# Patient Record
Sex: Female | Born: 1960 | Race: White | Hispanic: No | Smoking: Never smoker
Health system: Southern US, Community
[De-identification: ages and names within clinical notes are randomized; demographics above are authoritative.]

## PROBLEM LIST (undated history)

## (undated) DIAGNOSIS — R928 Other abnormal and inconclusive findings on diagnostic imaging of breast: Secondary | ICD-10-CM

## (undated) DIAGNOSIS — Z90710 Acquired absence of both cervix and uterus: Secondary | ICD-10-CM

## (undated) DIAGNOSIS — E782 Mixed hyperlipidemia: Secondary | ICD-10-CM

## (undated) HISTORY — DX: Acquired absence of both cervix and uterus: Z90.710

## (undated) HISTORY — PX: ABDOMINAL HYSTERECTOMY: SHX81

## (undated) HISTORY — DX: Mixed hyperlipidemia: E78.2

## (undated) HISTORY — PX: TONSILLECTOMY: SUR1361

---

## 2018-10-01 ENCOUNTER — Other Ambulatory Visit: Payer: Self-pay | Admitting: Medical

## 2018-10-01 DIAGNOSIS — R928 Other abnormal and inconclusive findings on diagnostic imaging of breast: Secondary | ICD-10-CM

## 2018-10-08 ENCOUNTER — Ambulatory Visit
Admission: RE | Admit: 2018-10-08 | Discharge: 2018-10-08 | Disposition: A | Discharge status: Home / Self Care | Payer: Managed Care, Other (non HMO) | Source: Ambulatory Visit | Attending: Medical | Admitting: Medical

## 2018-10-08 DIAGNOSIS — R928 Other abnormal and inconclusive findings on diagnostic imaging of breast: Secondary | ICD-10-CM

## 2018-11-12 ENCOUNTER — Other Ambulatory Visit: Payer: Self-pay | Admitting: General Surgery

## 2018-11-12 DIAGNOSIS — R928 Other abnormal and inconclusive findings on diagnostic imaging of breast: Secondary | ICD-10-CM

## 2018-11-19 NOTE — Pre-Procedure Instructions (Signed)
[facility]  11/19/2018      [facility] [place], [place] - [address] [place] [address] Phone: [phone] Fax: [phone]    Your procedure is scheduled on Tues., November 27, 2018 from 3:30PM-4:30PM  Report to [facility] Entrance "A" at 1:30PM  Call this number if you have problems the morning of surgery:  [phone]   Remember:  Do not eat after midnight on March 2nd  You may drink clear liquids until 3 hours (12:30PM) prior to surgery .  Clear liquids allowed are:  Water, Juice (non-citric and without pulp), Carbonated beverages, Clear Tea, Black Coffee only, Plain Jell-O only, Gatorade and Plain Popsicles only    Take these medicines the morning of surgery with A SIP OF WATER: NONE  As of today, stop taking all Other Aspirin Products, Vitamins, Fish oils, and Herbal medications. Also stop all NSAIDS i.e. Advil, Ibuprofen, Motrin, Aleve, Anaprox, Naproxen, BC, Goody Powders, and all Supplements.    Do not wear jewelry, make-up or nail polish.  Do not wear lotions, powders, or perfumes, or deodorant.  Do not shave 48 hours prior to surgery.    Do not bring valuables to the hospital.  [facility] is not responsible for any belongings or valuables.  Contacts, dentures or bridgework may not be worn into surgery.  Leave your suitcase in the car.  After surgery it may be brought to your room.  For patients admitted to the hospital, discharge time will be determined by your treatment team.  Patients discharged the day of surgery will not be allowed to drive home.   Special instructions:  [facility]- Preparing For Surgery  Before surgery, you can play an important role. Because skin is not sterile, your skin needs to be as free of germs as possible. You can reduce the number of germs on your skin by washing with CHG (chlorahexidine gluconate) Soap before surgery.  CHG is an antiseptic cleaner which kills germs and bonds with the skin to  continue killing germs even after washing.    Oral Hygiene is also important to reduce your risk of infection.  Remember - BRUSH YOUR TEETH THE MORNING OF SURGERY WITH YOUR REGULAR TOOTHPASTE  Please do not use if you have an allergy to CHG or antibacterial soaps. If your skin becomes reddened/irritated stop using the CHG.  Do not shave (including legs and underarms) for at least 48 hours prior to first CHG shower. It is OK to shave your face.  Please follow these instructions carefully.   1. Shower the NIGHT BEFORE SURGERY and the MORNING OF SURGERY with CHG.   2. If you chose to wash your hair, wash your hair first as usual with your normal shampoo.  3. After you shampoo, rinse your hair and body thoroughly to remove the shampoo.  4. Use CHG as you would any other liquid soap. You can apply CHG directly to the skin and wash gently with a scrungie or a clean washcloth.   5. Apply the CHG Soap to your body ONLY FROM THE NECK DOWN.  Do not use on open wounds or open sores. Avoid contact with your eyes, ears, mouth and genitals (private parts). Wash Face and genitals (private parts)  with your normal soap.  6. Wash thoroughly, paying special attention to the area where your surgery will be performed.  7. Thoroughly rinse your body with warm water from the neck down.  8. DO NOT shower/wash with your  normal soap after using and rinsing off the CHG Soap.  9. Pat yourself dry with a CLEAN TOWEL.  10. Wear CLEAN PAJAMAS to bed the night before surgery, wear comfortable clothes the morning of surgery  11. Place CLEAN SHEETS on your bed the night of your first shower and DO NOT SLEEP WITH PETS.  Day of Surgery:  Do not apply any deodorants/lotions.  Please wear clean clothes to the hospital/surgery center.   Remember to brush your teeth WITH YOUR REGULAR TOOTHPASTE.  Please read over the following fact sheets that you were given. Pain Booklet, Coughing and Deep Breathing and Surgical  Site Infection Prevention

## 2018-11-20 ENCOUNTER — Encounter (HOSPITAL_COMMUNITY): Payer: Self-pay

## 2018-11-20 ENCOUNTER — Other Ambulatory Visit: Payer: Self-pay

## 2018-11-20 ENCOUNTER — Encounter (HOSPITAL_COMMUNITY)
Admission: RE | Admit: 2018-11-20 | Discharge: 2018-11-20 | Disposition: A | Discharge status: Home / Self Care | Payer: Managed Care, Other (non HMO) | Source: Ambulatory Visit | Attending: General Surgery | Admitting: General Surgery

## 2018-11-20 DIAGNOSIS — Z01812 Encounter for preprocedural laboratory examination: Secondary | ICD-10-CM | POA: Insufficient documentation

## 2018-11-20 HISTORY — DX: Other abnormal and inconclusive findings on diagnostic imaging of breast: R92.8

## 2018-11-20 LAB — BASIC METABOLIC PANEL
Anion gap: 9 (ref 5–15)
BUN: 12 mg/dL (ref 6–20)
CO2: 25 mmol/L (ref 22–32)
Calcium: 9.4 mg/dL (ref 8.9–10.3)
Chloride: 105 mmol/L (ref 98–111)
Creatinine, Ser: 1.15 mg/dL — ABNORMAL HIGH (ref 0.44–1.00)
GFR calc Af Amer: >60 mL/min (ref >60)
GFR, EST NON AFRICAN AMERICAN: 53 mL/min — AB (ref >60)
Glucose, Bld: 104 mg/dL — ABNORMAL HIGH (ref 70–99)
Potassium: 4.3 mmol/L (ref 3.5–5.1)
Sodium: 139 mmol/L (ref 135–145)

## 2018-11-20 LAB — CBC
HCT: 40.9 % (ref 36.0–46.0)
Hemoglobin: 13.5 g/dL (ref 12.0–15.0)
MCH: 28.7 pg (ref 26.0–34.0)
MCHC: 33 g/dL (ref 30.0–36.0)
MCV: 87 fL (ref 80.0–100.0)
Platelets: 195 10*3/uL (ref 150–400)
RBC: 4.7 MIL/uL (ref 3.87–5.11)
RDW: 12.5 % (ref 11.5–15.5)
WBC: 5.1 10*3/uL (ref 4.0–10.5)
nRBC: 0 % (ref 0.0–0.2)

## 2018-11-20 NOTE — Progress Notes (Signed)
PCP - Dr. [name]  Cardiologist - Denies  Chest x-ray - Denies  EKG - Denies  Stress Test - Denies  ECHO - Denies  Cardiac Cath - Denies  AICD-na PM-na LOOP-na  Sleep Study - Denies CPAP - None  LABS- 11/20/2018: CBC, BMP  ASA-Denies   Anesthesia- No  Pt denies having chest pain, sob, or fever at this time. All instructions explained to the pt, with a verbal understanding of the material. Pt agrees to go over the instructions while at home for a better understanding. The opportunity to ask questions was provided.

## 2018-11-23 ENCOUNTER — Ambulatory Visit
Admission: RE | Admit: 2018-11-23 | Discharge: 2018-11-23 | Disposition: A | Discharge status: Home / Self Care | Payer: Managed Care, Other (non HMO) | Source: Ambulatory Visit | Attending: General Surgery | Admitting: General Surgery

## 2018-11-23 DIAGNOSIS — R928 Other abnormal and inconclusive findings on diagnostic imaging of breast: Secondary | ICD-10-CM

## 2018-11-26 NOTE — H&P (Signed)
Vanessa Morse Documented: 11/12/2018 1:43 PM Location: [facility] Patient #: [id number] DOB: [birth year] Divorced / Language: [place] / Race: White Female   History of Present Illness [name]; 11/12/2018 2:37 PM) The patient is a 58 year old female who presents with a complaint of Breast problems. 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 Patient underwent a screening mammogram and was fine found to have architectural distortion on the right upper outer quadrant. Diagnostic imaging was performed and core needle biopsy was done. 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  The patient has not had breast problems in the past. Her grandmother had breast cancer but she is not sure at what age. She does know her grandmother died when she was in her 72s, but also notes that she was a longtime smoker and so she is not sure of the cause of death.  Patient also reports that she has had an inverted right nipple as long as she can remember.   right breast biopsy 10/08/2018 CLINICAL DATA: 58 year old female with right breast distortion.  EXAM: RIGHT BREAST STEREOTACTIC CORE NEEDLE BIOPSY  COMPARISON: Previous exams.  FINDINGS: The patient and I discussed the procedure of stereotactic-guided biopsy including benefits and alternatives. We discussed the high likelihood of a successful procedure. We discussed the risks of the procedure including infection, bleeding, tissue injury, clip migration, and inadequate sampling. Informed written consent was given. The usual time out protocol was performed immediately prior to the procedure.  Using sterile technique and 1% Lidocaine as local anesthetic, under stereotactic guidance, a 9 gauge vacuum assisted device was used to perform core needle biopsy of distortion in the upper-outer  right breast anterior depth using a superior approach. Scratch  Lesion quadrant: Upper outer quadrant  At the conclusion of the procedure, a coil shaped tissue marker clip was deployed into the biopsy cavity. Follow-up 2-view mammogram was performed and dictated separately.  IMPRESSION: Stereotactic-guided biopsy of the right breast. No apparent complications.  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   Past Surgical History [name], [name]; 11/12/2018 1:44 PM) Breast Biopsy  Right. Hysterectomy (not due to cancer) - Complete  Tonsillectomy   Diagnostic Studies History [name], [name]; 11/12/2018 1:44 PM) Colonoscopy  never Mammogram  within last year Pap Smear  1-5 years ago  Allergies [name], [name]; 11/12/2018 1:45 PM) CODEINE  Allergies Reconciled   Medication History [name], [name]; 11/12/2018 1:45 PM) Medications Reconciled  Social History [name], [name]; 11/12/2018 1:44 PM) Alcohol use  Occasional alcohol use. Caffeine use  Tea. No drug use  Tobacco use  Never smoker.  Family History [name], [place]; 11/12/2018 1:44 PM) Arthritis  Mother. Breast Cancer  Family Members In General. Heart Disease  Mother. Hypertension  Brother, Father, Mother, Sister. Kidney Disease  Mother. Respiratory Condition  Family Members In General, Mother.  Pregnancy / Birth History [name], [place]; 11/12/2018 1:44 PM) Age at menarche  13 years. Age of menopause  103-50 Contraceptive History  Oral contraceptives. Gravida  0 Irregular periods  Para  0  Other Problems [name], [place]; 11/12/2018 1:44 PM) No pertinent past medical history  Oophorectomy     Review of Systems [name] [name]; 11/12/2018 1:44 PM) General Not Present- Appetite Loss, Chills, Fatigue, Fever, Night Sweats, Weight  Gain  and Weight Loss. Skin Not Present- Change in Wart/Mole, Dryness, Hives, Jaundice, New Lesions, Non-Healing Wounds, Rash and Ulcer. HEENT Present- Wears glasses/contact lenses. Not Present- Earache, Hearing Loss, Hoarseness, Nose Bleed, Oral Ulcers, Ringing in the Ears, Seasonal Allergies, Sinus Pain, Sore Throat, Visual Disturbances and Yellow Eyes. Respiratory Not Present- Bloody sputum, Chronic Cough, Difficulty Breathing, Snoring and Wheezing. Breast Present- Breast Mass. Not Present- Breast Pain, Nipple Discharge and Skin Changes. Cardiovascular Not Present- Chest Pain, Difficulty Breathing Lying Down, Leg Cramps, Palpitations, Rapid Heart Rate, Shortness of Breath and Swelling of Extremities. Gastrointestinal Not Present- Abdominal Pain, Bloating, Bloody Stool, Change in Bowel Habits, Chronic diarrhea, Constipation, Difficulty Swallowing, Excessive gas, Gets full quickly at meals, Hemorrhoids, Indigestion, Nausea, Rectal Pain and Vomiting. Female Genitourinary Not Present- Frequency, Nocturia, Painful Urination, Pelvic Pain and Urgency. Musculoskeletal Not Present- Back Pain, Joint Pain, Joint Stiffness, Muscle Pain, Muscle Weakness and Swelling of Extremities. Neurological Not Present- Decreased Memory, Fainting, Headaches, Numbness, Seizures, Tingling, Tremor, Trouble walking and Weakness. Psychiatric Not Present- Anxiety, Bipolar, Change in Sleep Pattern, Depression, Fearful and Frequent crying. Endocrine Not Present- Cold Intolerance, Excessive Hunger, Hair Changes, Heat Intolerance, Hot flashes and New Diabetes. Hematology Not Present- Blood Thinners, Easy Bruising, Excessive bleeding, Gland problems, HIV and Persistent Infections.  Vitals [name] [name]; 11/12/2018 1:44 PM) 11/12/2018 1:44 PM Weight: 175.2 lb Height: 64in Body Surface Area: 1.85 m Body Mass Index: 30.07 kg/m  Temp.: 97.32F  Pulse: 104 (Regular)  BP: 152/84 (Sitting, Left Arm,  Standard)       Physical Exam [name]; 11/12/2018 2:38 PM) General Mental Status-Alert. General Appearance-Consistent with stated age. Hydration-Well hydrated. Voice-Normal.  Head and Neck Head-normocephalic, atraumatic with no lesions or palpable masses. Trachea-midline. Thyroid Gland Characteristics - normal size and consistency.  Eye Eyeball - Bilateral-Extraocular movements intact. Sclera/Conjunctiva - Bilateral-No scleral icterus.  Chest and Lung Exam Chest and lung exam reveals -quiet, even and easy respiratory effort with no use of accessory muscles and on auscultation, normal breath sounds, no adventitious sounds and normal vocal resonance. Inspection Chest Wall - Normal. Back - normal.  Breast Note: no palpable lesions. right nipple inverted. mild ptosis. no LAD. no nipple discharge. left breast normal. right breast sl smaller.   Cardiovascular Cardiovascular examination reveals -normal heart sounds, regular rate and rhythm with no murmurs and normal pedal pulses bilaterally.  Abdomen Inspection Inspection of the abdomen reveals - No Hernias. Palpation/Percussion Palpation and Percussion of the abdomen reveal - Soft, Non Tender, No Rebound tenderness, No Rigidity (guarding) and No hepatosplenomegaly. Auscultation Auscultation of the abdomen reveals - Bowel sounds normal.  Neurologic Neurologic evaluation reveals -alert and oriented x 3 with no impairment of recent or remote memory. Mental Status-Normal.  Musculoskeletal Global Assessment -Note: no gross deformities.  Normal Exam - Left-Upper Extremity Strength Normal and Lower Extremity Strength Normal. Normal Exam - Right-Upper Extremity Strength Normal and Lower Extremity Strength Normal.  Lymphatic Head & Neck  General Head & Neck Lymphatics: Bilateral - Description - Normal. Axillary  General Axillary Region: Bilateral - Description - Normal. Tenderness -  Non Tender. Femoral & Inguinal  Generalized Femoral & Inguinal Lymphatics: Bilateral - Description - No Generalized lymphadenopathy.    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 We will do a seed localized excisional biopsy.  I discussed the procedure with the patient. I reviewed  the timeline as well as risks associated with the procedure. I discussed risk of bleeding, infection, seroma, pathology of cancer, possible need for additional procedures.  I discussed that I would try my best to hide the scar in her axilla or at the border of her nipple. I discussed how we examined the specimen in the operating room to make sure we have the previous biopsy site. We will do this at the first available opportunity. Current Plans Pt Education - [facility] Breast Biopsy HCI: discussed with patient and provided information. You are being scheduled for surgery- Our schedulers will call you.  You should hear from our office's scheduling department within 5 working days about the location, date, and time of surgery. We try to make accommodations for patient's preferences in scheduling surgery, but sometimes the OR schedule or the surgeon's schedule prevents [place] from making those accommodations.  If you have not heard from our office [phone]) in 5 working days, call the office and ask for your surgeon's nurse.  If you have other questions about your diagnosis, plan, or surgery, call the office and ask for your surgeon's nurse.    Signed by Almond Lint, MD (11/12/2018 2:40 PM)

## 2018-11-27 ENCOUNTER — Encounter (HOSPITAL_COMMUNITY)
Admission: RE | Disposition: A | Discharge status: Home / Self Care | Payer: Self-pay | Source: Home / Self Care | Attending: General Surgery

## 2018-11-27 ENCOUNTER — Encounter (HOSPITAL_COMMUNITY): Payer: Self-pay | Admitting: *Deleted

## 2018-11-27 ENCOUNTER — Ambulatory Visit (HOSPITAL_COMMUNITY): Payer: Managed Care, Other (non HMO) | Admitting: Physician Assistant

## 2018-11-27 ENCOUNTER — Ambulatory Visit
Admission: RE | Admit: 2018-11-27 | Discharge: 2018-11-27 | Disposition: A | Discharge status: Home / Self Care | Payer: Managed Care, Other (non HMO) | Source: Ambulatory Visit | Attending: General Surgery | Admitting: General Surgery

## 2018-11-27 ENCOUNTER — Ambulatory Visit (HOSPITAL_COMMUNITY)
Admission: RE | Admit: 2018-11-27 | Discharge: 2018-11-27 | Disposition: A | Discharge status: Home / Self Care | Payer: Managed Care, Other (non HMO) | Attending: General Surgery | Admitting: General Surgery

## 2018-11-27 ENCOUNTER — Ambulatory Visit (HOSPITAL_COMMUNITY): Payer: Managed Care, Other (non HMO) | Admitting: Anesthesiology

## 2018-11-27 DIAGNOSIS — Z885 Allergy status to narcotic agent status: Secondary | ICD-10-CM | POA: Diagnosis not present

## 2018-11-27 DIAGNOSIS — Z8249 Family history of ischemic heart disease and other diseases of the circulatory system: Secondary | ICD-10-CM | POA: Insufficient documentation

## 2018-11-27 DIAGNOSIS — R928 Other abnormal and inconclusive findings on diagnostic imaging of breast: Secondary | ICD-10-CM

## 2018-11-27 DIAGNOSIS — R921 Mammographic calcification found on diagnostic imaging of breast: Secondary | ICD-10-CM | POA: Diagnosis present

## 2018-11-27 DIAGNOSIS — N6489 Other specified disorders of breast: Secondary | ICD-10-CM | POA: Diagnosis not present

## 2018-11-27 DIAGNOSIS — N6011 Diffuse cystic mastopathy of right breast: Secondary | ICD-10-CM | POA: Insufficient documentation

## 2018-11-27 HISTORY — PX: RADIOACTIVE SEED GUIDED EXCISIONAL BREAST BIOPSY: SHX6490

## 2018-11-27 SURGERY — RADIOACTIVE SEED GUIDED BREAST BIOPSY
Anesthesia: General | Site: Breast | Laterality: Right

## 2018-11-27 MED ORDER — DEXAMETHASONE SODIUM PHOSPHATE 10 MG/ML IJ SOLN
INTRAMUSCULAR | Status: DC | PRN
  Administered 2018-11-27: 5 mg via INTRAVENOUS

## 2018-11-27 MED ORDER — ONDANSETRON HCL 4 MG/2ML IJ SOLN
INTRAMUSCULAR | Status: DC | PRN
  Administered 2018-11-27: 4 mg via INTRAVENOUS

## 2018-11-27 MED ORDER — CEFAZOLIN SODIUM-DEXTROSE 2-4 GM/100ML-% IV SOLN
2.0000 g | INTRAVENOUS | Status: AC
  Administered 2018-11-27: 2 g via INTRAVENOUS

## 2018-11-27 MED ORDER — LACTATED RINGERS IV SOLN
INTRAVENOUS | Status: DC
  Administered 2018-11-27 (×3): via INTRAVENOUS

## 2018-11-27 MED ORDER — LIDOCAINE HCL 1 % IJ SOLN
INTRAMUSCULAR | Status: DC | PRN
  Administered 2018-11-27: 10 mL

## 2018-11-27 MED ORDER — 0.9 % SODIUM CHLORIDE (POUR BTL) OPTIME
TOPICAL | Status: DC | PRN
  Administered 2018-11-27: 1000 mL

## 2018-11-27 MED ORDER — ONDANSETRON HCL 4 MG/2ML IJ SOLN
INTRAMUSCULAR | Status: AC
  Filled 2018-11-27: qty 2

## 2018-11-27 MED ORDER — LIDOCAINE 2% (20 MG/ML) 5 ML SYRINGE
INTRAMUSCULAR | Status: AC
  Filled 2018-11-27: qty 5

## 2018-11-27 MED ORDER — PROPOFOL 10 MG/ML IV BOLUS
INTRAVENOUS | Status: DC | PRN
  Administered 2018-11-27 (×2): 100 mg via INTRAVENOUS

## 2018-11-27 MED ORDER — PROMETHAZINE HCL 25 MG/ML IJ SOLN: 6.2500 mg | INTRAMUSCULAR | Status: DC | PRN

## 2018-11-27 MED ORDER — ACETAMINOPHEN 500 MG PO TABS
ORAL_TABLET | ORAL | Status: AC
  Administered 2018-11-27: 1000 mg via ORAL
  Filled 2018-11-27: qty 2

## 2018-11-27 MED ORDER — FENTANYL CITRATE (PF) 250 MCG/5ML IJ SOLN
INTRAMUSCULAR | Status: DC | PRN
  Administered 2018-11-27: 50 ug via INTRAVENOUS
  Administered 2018-11-27: 25 ug via INTRAVENOUS
  Administered 2018-11-27 (×2): 50 ug via INTRAVENOUS

## 2018-11-27 MED ORDER — CHLORHEXIDINE GLUCONATE CLOTH 2 % EX PADS: 6.0000 | MEDICATED_PAD | Freq: Once | CUTANEOUS | Status: DC

## 2018-11-27 MED ORDER — CELECOXIB 200 MG PO CAPS
ORAL_CAPSULE | ORAL | Status: AC
  Administered 2018-11-27: 200 mg via ORAL
  Filled 2018-11-27: qty 1

## 2018-11-27 MED ORDER — DEXAMETHASONE SODIUM PHOSPHATE 10 MG/ML IJ SOLN
INTRAMUSCULAR | Status: AC
  Filled 2018-11-27: qty 1

## 2018-11-27 MED ORDER — SCOPOLAMINE 1 MG/3DAYS TD PT72
1.0000 | MEDICATED_PATCH | Freq: Once | TRANSDERMAL | Status: DC
  Administered 2018-11-27: 1.5 mg via TRANSDERMAL
  Filled 2018-11-27: qty 1

## 2018-11-27 MED ORDER — OXYCODONE HCL 5 MG PO TABS: 5.0000 mg | ORAL_TABLET | Freq: Four times a day (QID) | ORAL | 0 refills | Status: DC | PRN

## 2018-11-27 MED ORDER — LIDOCAINE-EPINEPHRINE 1 %-1:100000 IJ SOLN
INTRAMUSCULAR | Status: AC
  Filled 2018-11-27: qty 1

## 2018-11-27 MED ORDER — MEPERIDINE HCL 50 MG/ML IJ SOLN: 6.2500 mg | INTRAMUSCULAR | Status: DC | PRN

## 2018-11-27 MED ORDER — ACETAMINOPHEN 500 MG PO TABS
1000.0000 mg | ORAL_TABLET | ORAL | Status: AC
  Administered 2018-11-27: 1000 mg via ORAL

## 2018-11-27 MED ORDER — MIDAZOLAM HCL 2 MG/2ML IJ SOLN: 0.5000 mg | Freq: Once | INTRAMUSCULAR | Status: DC | PRN

## 2018-11-27 MED ORDER — CELECOXIB 200 MG PO CAPS
200.0000 mg | ORAL_CAPSULE | ORAL | Status: AC
  Administered 2018-11-27: 200 mg via ORAL

## 2018-11-27 MED ORDER — CEFAZOLIN SODIUM-DEXTROSE 2-4 GM/100ML-% IV SOLN
INTRAVENOUS | Status: AC
  Filled 2018-11-27: qty 100

## 2018-11-27 MED ORDER — PROPOFOL 10 MG/ML IV BOLUS
INTRAVENOUS | Status: AC
  Filled 2018-11-27: qty 20

## 2018-11-27 MED ORDER — BUPIVACAINE HCL (PF) 0.25 % IJ SOLN
INTRAMUSCULAR | Status: AC
  Filled 2018-11-27: qty 30

## 2018-11-27 MED ORDER — LIDOCAINE 2% (20 MG/ML) 5 ML SYRINGE
INTRAMUSCULAR | Status: DC | PRN
  Administered 2018-11-27: 80 mg via INTRAVENOUS

## 2018-11-27 MED ORDER — MIDAZOLAM HCL 2 MG/2ML IJ SOLN
INTRAMUSCULAR | Status: DC | PRN
  Administered 2018-11-27: 2 mg via INTRAVENOUS

## 2018-11-27 MED ORDER — MIDAZOLAM HCL 2 MG/2ML IJ SOLN
INTRAMUSCULAR | Status: AC
  Filled 2018-11-27: qty 2

## 2018-11-27 MED ORDER — FENTANYL CITRATE (PF) 100 MCG/2ML IJ SOLN: 25.0000 ug | INTRAMUSCULAR | Status: DC | PRN

## 2018-11-27 MED ORDER — FENTANYL CITRATE (PF) 250 MCG/5ML IJ SOLN
INTRAMUSCULAR | Status: AC
  Filled 2018-11-27: qty 5

## 2018-11-27 SURGICAL SUPPLY — 45 items
APPLIER CLIP 9.375 MED OPEN (MISCELLANEOUS)
BINDER BREAST LRG (GAUZE/BANDAGES/DRESSINGS) IMPLANT
BINDER BREAST XLRG (GAUZE/BANDAGES/DRESSINGS) IMPLANT
BLADE SURG 10 STRL SS (BLADE) ×3 IMPLANT
CANISTER SUCT 3000ML PPV (MISCELLANEOUS) ×3 IMPLANT
CHLORAPREP W/TINT 26ML (MISCELLANEOUS) ×3 IMPLANT
CLIP APPLIE 9.375 MED OPEN (MISCELLANEOUS) IMPLANT
CLIP VESOCCLUDE LG 6/CT (CLIP) IMPLANT
CLOSURE WOUND 1/2 X4 (GAUZE/BANDAGES/DRESSINGS) ×1
COVER PROBE W GEL 5X96 (DRAPES) ×3 IMPLANT
COVER SURGICAL LIGHT HANDLE (MISCELLANEOUS) ×3 IMPLANT
COVER WAND RF STERILE (DRAPES) ×3 IMPLANT
DERMABOND ADVANCED (GAUZE/BANDAGES/DRESSINGS) ×2
DERMABOND ADVANCED .7 DNX12 (GAUZE/BANDAGES/DRESSINGS) ×1 IMPLANT
DEVICE DUBIN SPECIMEN MAMMOGRA (MISCELLANEOUS) ×3 IMPLANT
DRAPE CHEST BREAST 15X10 FENES (DRAPES) ×3 IMPLANT
DRSG PAD ABDOMINAL 8X10 ST (GAUZE/BANDAGES/DRESSINGS) ×3 IMPLANT
ELECT CAUTERY BLADE 6.4 (BLADE) ×3 IMPLANT
ELECT COATED BLADE 2.86 ST (ELECTRODE) ×2 IMPLANT
ELECT REM PT RETURN 9FT ADLT (ELECTROSURGICAL) ×3
ELECTRODE REM PT RTRN 9FT ADLT (ELECTROSURGICAL) ×1 IMPLANT
GAUZE SPONGE 4X4 12PLY STRL (GAUZE/BANDAGES/DRESSINGS) ×2 IMPLANT
GLOVE BIO SURGEON STRL SZ 6 (GLOVE) ×3 IMPLANT
GLOVE INDICATOR 6.5 STRL GRN (GLOVE) ×3 IMPLANT
GOWN STRL REUS W/ TWL LRG LVL3 (GOWN DISPOSABLE) ×1 IMPLANT
GOWN STRL REUS W/TWL 2XL LVL3 (GOWN DISPOSABLE) ×3 IMPLANT
GOWN STRL REUS W/TWL LRG LVL3 (GOWN DISPOSABLE) ×2
ILLUMINATOR WAVEGUIDE N/F (MISCELLANEOUS) IMPLANT
KIT BASIN OR (CUSTOM PROCEDURE TRAY) ×3 IMPLANT
KIT MARKER MARGIN INK (KITS) ×3 IMPLANT
LIGHT WAVEGUIDE WIDE FLAT (MISCELLANEOUS) IMPLANT
NDL HYPO 25GX1X1/2 BEV (NEEDLE) ×1 IMPLANT
NEEDLE HYPO 25GX1X1/2 BEV (NEEDLE) ×3 IMPLANT
NS IRRIG 1000ML POUR BTL (IV SOLUTION) ×3 IMPLANT
PACK GENERAL/GYN (CUSTOM PROCEDURE TRAY) ×3 IMPLANT
PAD ABD 8X10 STRL (GAUZE/BANDAGES/DRESSINGS) ×2 IMPLANT
STRIP CLOSURE SKIN 1/2X4 (GAUZE/BANDAGES/DRESSINGS) ×2 IMPLANT
SUT MNCRL AB 4-0 PS2 18 (SUTURE) ×3 IMPLANT
SUT VIC AB 2-0 SH 27 (SUTURE) ×2
SUT VIC AB 2-0 SH 27XBRD (SUTURE) ×1 IMPLANT
SUT VIC AB 3-0 SH 27 (SUTURE) ×2
SUT VIC AB 3-0 SH 27X BRD (SUTURE) ×1 IMPLANT
SYR CONTROL 10ML LL (SYRINGE) ×3 IMPLANT
TOWEL OR 17X24 6PK STRL BLUE (TOWEL DISPOSABLE) ×3 IMPLANT
TOWEL OR 17X26 10 PK STRL BLUE (TOWEL DISPOSABLE) ×3 IMPLANT

## 2018-11-27 NOTE — Anesthesia Postprocedure Evaluation (Signed)
Anesthesia Post Note  Patient: Vanessa Morse  Procedure(s) Performed: RIGHT RADIOACTIVE SEED GUIDED EXCISIONAL BREAST BIOPSY ERAS PATHWAY (Right Breast)     Patient location during evaluation: PACU Anesthesia Type: General Level of consciousness: awake and alert, oriented and patient cooperative Pain management: pain level controlled Vital Signs Assessment: post-procedure vital signs reviewed and stable Respiratory status: spontaneous breathing, nonlabored ventilation and respiratory function stable Cardiovascular status: blood pressure returned to baseline and stable Postop Assessment: no apparent nausea or vomiting Anesthetic complications: no    Last Vitals:  Vitals:   11/27/18 1802 11/27/18 1820  BP: 138/75 134/73  Pulse: 69 63  Resp: 13 14  Temp: 36.5 C   SpO2: 98% 98%    Last Pain:  Vitals:   11/27/18 1802  TempSrc:   PainSc: 0-No pain                 Jhoel Stieg,E. Dennis Hegeman

## 2018-11-27 NOTE — Anesthesia Preprocedure Evaluation (Addendum)
Anesthesia Evaluation  Patient identified by MRN, date of birth, ID band Patient awake    Reviewed: Allergy & Precautions, NPO status , Patient's Chart, lab work & pertinent test results  History of Anesthesia Complications Negative for: history of anesthetic complications  Airway Mallampati: II  TM Distance: >3 FB Neck ROM: Full    Dental  (+) Caps, Dental Advisory Given   Pulmonary neg pulmonary ROS,    breath sounds clear to auscultation       Cardiovascular negative cardio ROS   Rhythm:Regular Rate:Normal     Neuro/Psych negative neurological ROS     GI/Hepatic negative GI ROS, Neg liver ROS,   Endo/Other  negative endocrine ROS  Renal/GU negative Renal ROS     Musculoskeletal   Abdominal   Peds  Hematology negative hematology ROS (+)   Anesthesia Other Findings   Reproductive/Obstetrics                             Anesthesia Physical Anesthesia Plan  ASA: II  Anesthesia Plan: General   Post-op Pain Management:    Induction: Intravenous  PONV Risk Score and Plan: 3 and Ondansetron, Dexamethasone and Scopolamine patch - Pre-op  Airway Management Planned: LMA  Additional Equipment:   Intra-op Plan:   Post-operative Plan:   Informed Consent: I have reviewed the patients History and Physical, chart, labs and discussed the procedure including the risks, benefits and alternatives for the proposed anesthesia with the patient or authorized representative who has indicated his/her understanding and acceptance.     Dental advisory given  Plan Discussed with: CRNA and Surgeon  Anesthesia Plan Comments: (Plan routine monitors, GA- LMA OK)        Anesthesia Quick Evaluation

## 2018-11-27 NOTE — Discharge Instructions (Addendum)
[facility] °Office Phone Number [phone] ° °BREAST BIOPSY/ PARTIAL MASTECTOMY: POST OP INSTRUCTIONS ° °Always review your discharge instruction sheet given to you by the facility where your surgery was performed. ° °IF YOU HAVE DISABILITY OR FAMILY LEAVE FORMS, YOU MUST BRING THEM TO THE OFFICE FOR PROCESSING.  DO NOT GIVE THEM TO YOUR DOCTOR. ° °1. A prescription for pain medication may be given to you upon discharge.  Take your pain medication as prescribed, if needed.  If narcotic pain medicine is not needed, then you may take acetaminophen (Tylenol) or ibuprofen (Advil) as needed. °2. Take your usually prescribed medications unless otherwise directed °3. If you need a refill on your pain medication, please contact your pharmacy.  They will contact our office to request authorization.  Prescriptions will not be filled after 5pm or on week-ends. °4. You should eat very light the first 24 hours after surgery, such as soup, crackers, pudding, etc.  Resume your normal diet the day after surgery. °5. Most patients will experience some swelling and bruising in the breast.  Ice packs and a good support bra will help.  Swelling and bruising can take several days to resolve.  °6. It is common to experience some constipation if taking pain medication after surgery.  Increasing fluid intake and taking a stool softener will usually help or prevent this problem from occurring.  A mild laxative (Milk of Magnesia or Miralax) should be taken according to package directions if there are no bowel movements after 48 hours. °7. Unless discharge instructions indicate otherwise, you may remove your bandages 48 hours after surgery, and you may shower at that time.  You may have steri-strips (small skin tapes) in place directly over the incision.  These strips should be left on the skin for 7-10 days.   Any sutures or staples will be removed at the office during your follow-up visit. °8. ACTIVITIES:  You may resume  regular daily activities (gradually increasing) beginning the next day.  Wearing a good support bra or sports bra (or the breast binder) minimizes pain and swelling.  You may have sexual intercourse when it is comfortable. °a. You may drive when you no longer are taking prescription pain medication, you can comfortably wear a seatbelt, and you can safely maneuver your car and apply brakes. °b. RETURN TO WORK:  __________1 week_______________ °9. You should see your doctor in the office for a follow-up appointment approximately two weeks after your surgery.  Your doctor’s nurse will typically make your follow-up appointment when she calls you with your pathology report.  Expect your pathology report 2-3 business days after your surgery.  You may call to check if you do not hear from us after three days. ° ° °WHEN TO CALL YOUR DOCTOR: °1. Fever over 101.0 °2. Nausea and/or vomiting. °3. Extreme swelling or bruising. °4. Continued bleeding from incision. °5. Increased pain, redness, or drainage from the incision. ° °The clinic staff is available to answer your questions during regular business hours.  Please don’t hesitate to call and ask to speak to one of the nurses for clinical concerns.  If you have a medical emergency, go to the nearest emergency room or call 911.  A surgeon from [facility] is always on call at the hospital. ° °For further questions, please visit [email or web address]  ° °

## 2018-11-27 NOTE — Op Note (Signed)
Right Breast Radioactive seed localized excisional biopsy  Indications: This patient presents with history of abnormal right mammogram and core needle biopsy showing complex sclerosing lesion.  Excision was recommended.    Pre-operative Diagnosis: abnormal right mammogram  Post-operative Diagnosis: Same  Surgeon: Stark Klein   Anesthesia: General endotracheal anesthesia  ASA Class: 2  Procedure Details  The patient was seen in the Holding Room. The risks, benefits, complications, treatment options, and expected outcomes were discussed with the patient. The possibilities of bleeding, infection, the need for additional procedures, failure to diagnose a condition, and creating a complication requiring transfusion or operation were discussed with the patient. The patient concurred with the proposed plan, giving informed consent.  The site of surgery properly noted/marked. The patient was taken to Operating Room # 2, identified, and the procedure verified as Right Breast Seed localized excisional biopsy. A Time Out was held and the above information confirmed.  The right breast and chest were prepped and draped in standard fashion. The lumpectomy was performed by creating a circumareolar incision near the previously placed radioactive seed.  Dissection was carried down to around the point of maximum signal intensity. The cautery was used to perform the dissection.  Hemostasis was achieved with cautery. One large surgical clip was placed posteriorly.  The specimen was inked with the margin marker paint kit.    Specimen radiography confirmed inclusion of the mammographic lesion, the clip, and the seed.  The background signal in the breast was zero.  The wound was irrigated and closed with 3-0 vicryl in layers and 4-0 monocryl subcuticular suture.      Sterile dressings were applied. At the end of the operation, all sponge, instrument, and needle counts were correct.  Findings: grossly clear surgical  margins and no adenopathy, anterior margin is skin  Estimated Blood Loss:  min         Specimens: right breast tissue with seed         Complications:  None; patient tolerated the procedure well.         Disposition: PACU - hemodynamically stable.         Condition: stable

## 2018-11-27 NOTE — Interval H&P Note (Signed)
History and Physical Interval Note:  11/27/2018 3:32 PM  Vanessa Morse  has presented today for surgery, with the diagnosis of right breast abnormal mammogram  The various methods of treatment have been discussed with the patient and family. After consideration of risks, benefits and other options for treatment, the patient has consented to  Procedure(s): RIGHT RADIOACTIVE SEED GUIDED EXCISIONAL BREAST BIOPSY ERAS PATHWAY (Right) as a surgical intervention .  The patient's history has been reviewed, patient examined, no change in status, stable for surgery.  I have reviewed the patient's chart and labs.  Questions were answered to the patient's satisfaction.     Almond Lint

## 2018-11-27 NOTE — Anesthesia Procedure Notes (Signed)
Procedure Name: LMA Insertion Date/Time: 11/27/2018 4:19 PM Performed by: Tillman Abide, CRNA Pre-anesthesia Checklist: Patient identified, Emergency Drugs available, Suction available and Patient being monitored Patient Re-evaluated:Patient Re-evaluated prior to induction Oxygen Delivery Method: Circle System Utilized Preoxygenation: Pre-oxygenation with 100% oxygen Induction Type: IV induction Ventilation: Mask ventilation without difficulty LMA: LMA inserted LMA Size: 4.0 Number of attempts: 1 Placement Confirmation: positive ETCO2 Tube secured with: Tape Dental Injury: Teeth and Oropharynx as per pre-operative assessment

## 2018-11-27 NOTE — Transfer of Care (Signed)
Immediate Anesthesia Transfer of Care Note  Patient: Vanessa Morse  Procedure(s) Performed: RIGHT RADIOACTIVE SEED GUIDED EXCISIONAL BREAST BIOPSY ERAS PATHWAY (Right Breast)  Patient Location: PACU  Anesthesia Type:General  Level of Consciousness: awake, alert  and oriented  Airway & Oxygen Therapy: Patient Spontanous Breathing and Patient connected to nasal cannula oxygen  Post-op Assessment: Report given to RN and Post -op Vital signs reviewed and stable  Post vital signs: Reviewed and stable  Last Vitals:  Vitals Value Taken Time  BP 136/66   Temp    Pulse 65 11/27/2018  5:32 PM  Resp 22 11/27/2018  5:32 PM  SpO2 100 % 11/27/2018  5:32 PM  Vitals shown include unvalidated device data.  Last Pain:  Vitals:   11/27/18 1322  TempSrc: Oral         Complications: No apparent anesthesia complications

## 2018-11-28 ENCOUNTER — Encounter (HOSPITAL_COMMUNITY): Payer: Self-pay | Admitting: General Surgery

## 2018-11-30 NOTE — Progress Notes (Signed)
Please let patient know that there is no cancer in the biopsy!

## 2020-01-08 ENCOUNTER — Encounter: Payer: Self-pay | Admitting: Family Medicine

## 2020-05-31 IMAGING — MG STEREOTACTIC VACUUM ASSIST RIGHT
8 of 15 series · 8 of 27 positions shown · non-contrast
Comparison: Previous exams.

Addendum:
CLINICAL DATA: 57-year-old female with right breast distortion.

EXAM:
RIGHT BREAST STEREOTACTIC CORE NEEDLE BIOPSY

[R (1 of 8)]
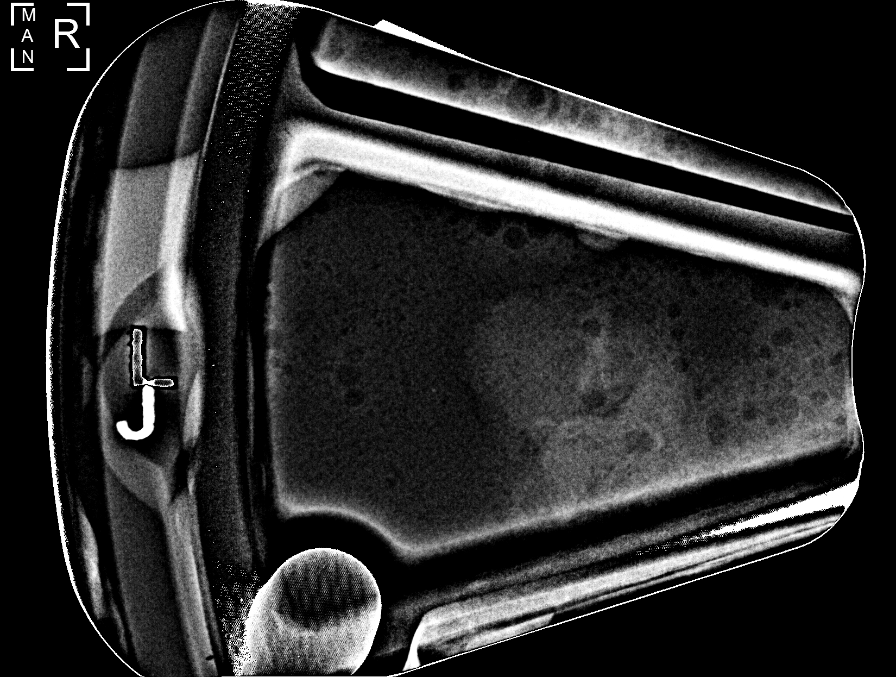

[R (2 of 8)]
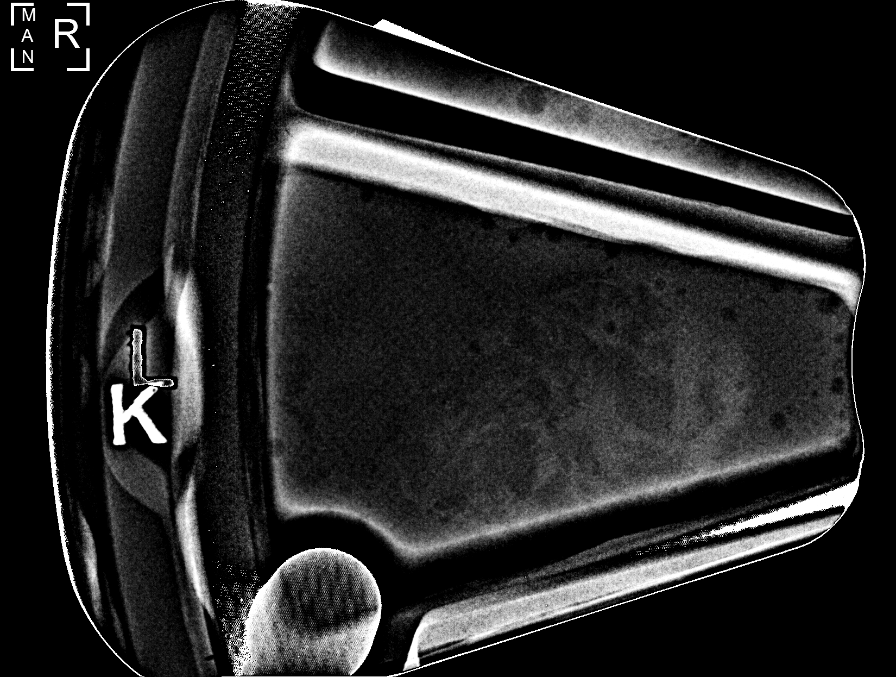

[R (3 of 8)]
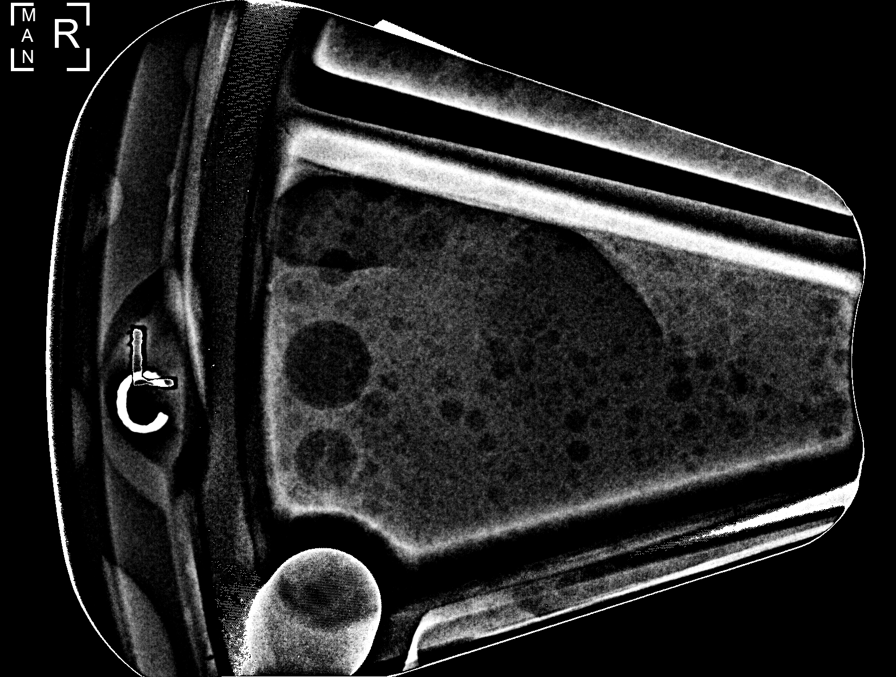

[R (4 of 8)]
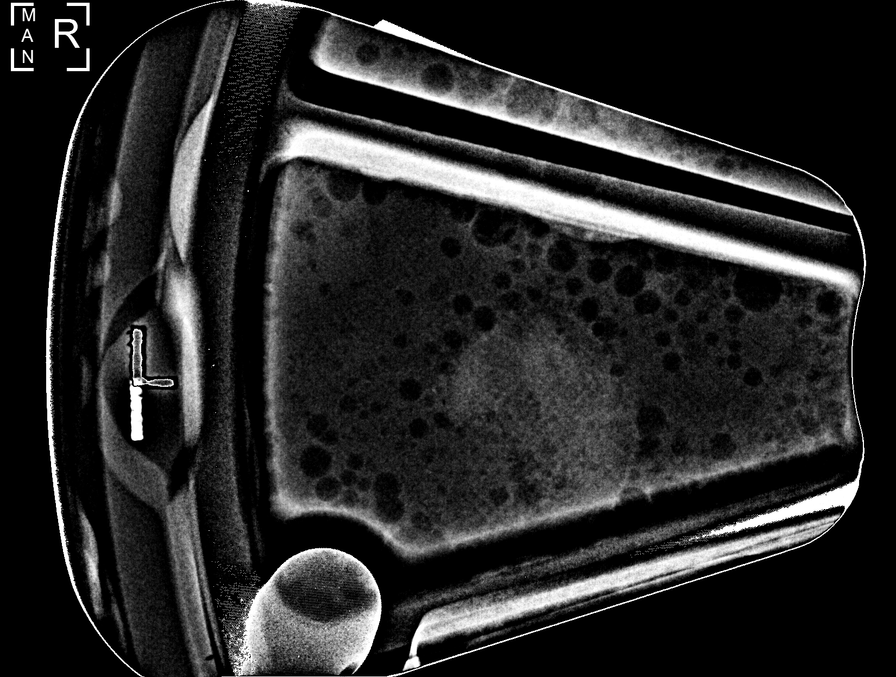

[R (5 of 8)]
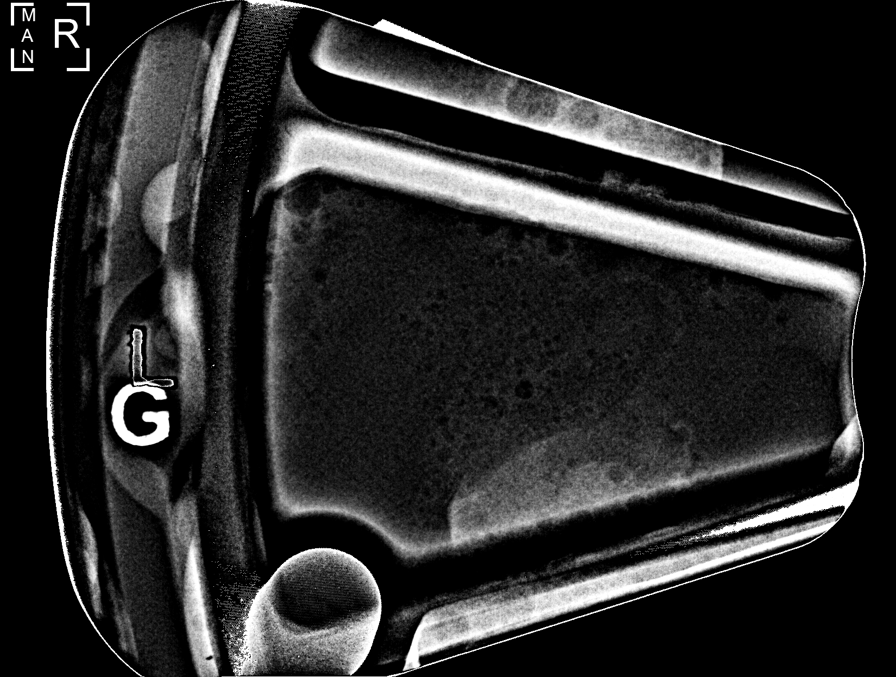

[R (6 of 8)]
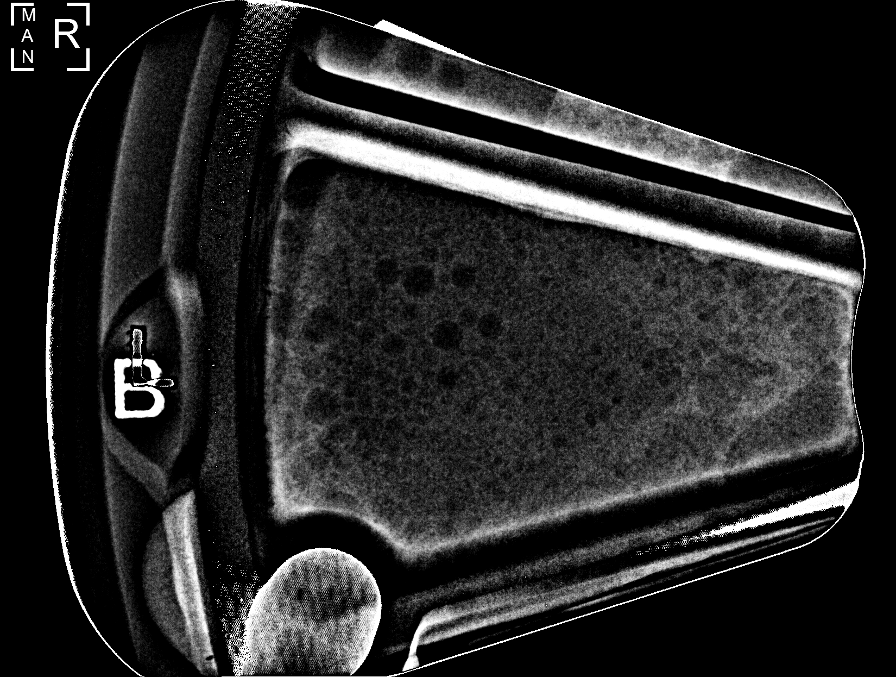

[R (7 of 8)]
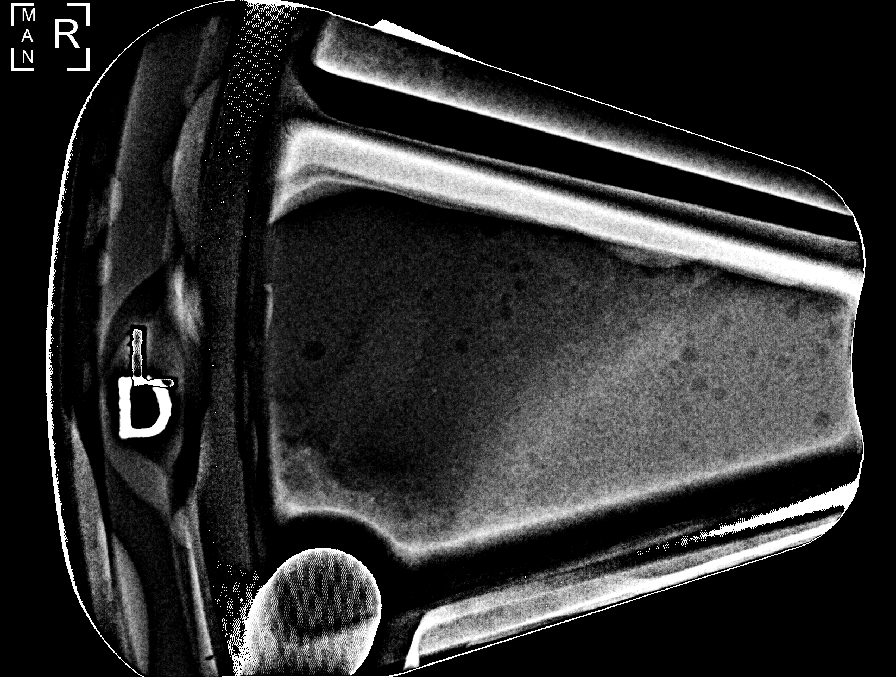

[R (8 of 8)]
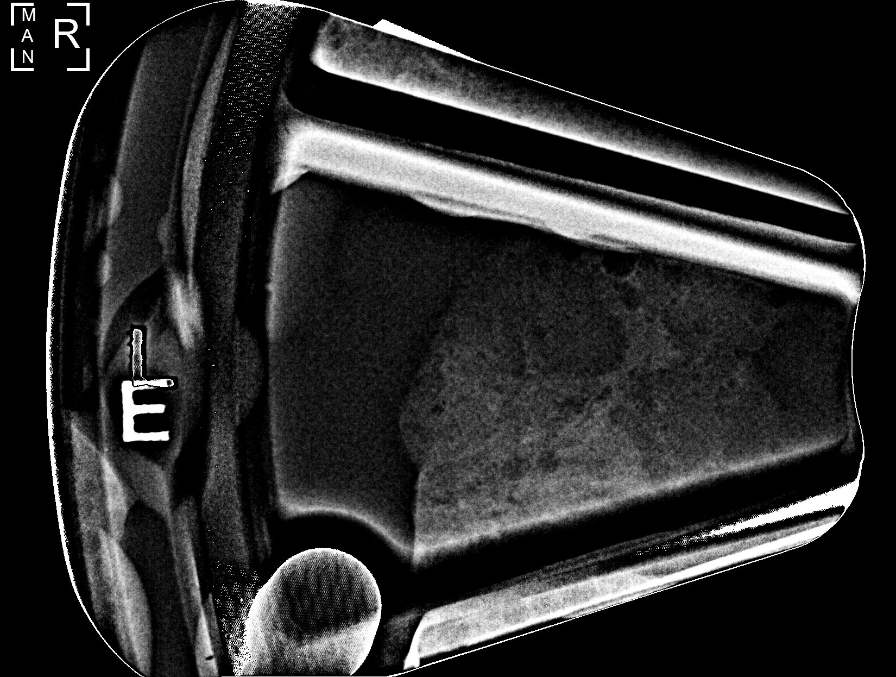

[8 of 27 positions shown; findings below may reference images not displayed]



Using sterile technique and 1% Lidocaine as local anesthetic, under
stereotactic guidance, a 9 gauge vacuum assisted device was used to
perform core needle biopsy of distortion in the upper-outer right
breast anterior depth using a superior approach. Scratch

Lesion quadrant: Upper outer quadrant

At the conclusion of the procedure, a coil shaped tissue marker clip
was deployed into the biopsy cavity. Follow-up 2-view mammogram was
performed and dictated separately.
IMPRESSION: Stereotactic-guided biopsy of the right breast. No apparent
complications.

ADDENDUM:
Pathology revealed COMPLEX SCLEROSING LESION WITH CALCIFICATIONS AND
USUAL DUCTAL HYPERPLASIA of the Right breast, upper outer quadrant,
anterior. This was found to be concordant by Dr. Nazareth Jumper, with
excision recommended.

Pathology results were discussed with the patient by telephone. The
patient reported doing well after the biopsy with tenderness at the
site. Post biopsy instructions and care were reviewed and questions
were answered. The patient was encouraged to call The [REDACTED]

Pathology results were called to Klpigbb Moolman, PA, at [facility]
surgical referral at the request of the patient.

Pathology results reported by Rene Rolando Yoongi, RN on 10/11/2018.

*** End of Addendum ***

## 2020-07-13 ENCOUNTER — Encounter: Payer: Self-pay | Admitting: Nurse Practitioner

## 2020-07-13 DIAGNOSIS — E782 Mixed hyperlipidemia: Secondary | ICD-10-CM | POA: Insufficient documentation

## 2020-07-14 ENCOUNTER — Other Ambulatory Visit: Payer: Self-pay

## 2020-07-14 ENCOUNTER — Ambulatory Visit (INDEPENDENT_AMBULATORY_CARE_PROVIDER_SITE_OTHER): Payer: Managed Care, Other (non HMO) | Admitting: Nurse Practitioner

## 2020-07-14 ENCOUNTER — Encounter: Payer: Self-pay | Admitting: Nurse Practitioner

## 2020-07-14 VITALS — BP 132/80 | HR 81 | Temp 97.4°F | Ht 62.5 in | Wt 175.4 lb

## 2020-07-14 DIAGNOSIS — Z0000 Encounter for general adult medical examination without abnormal findings: Secondary | ICD-10-CM | POA: Diagnosis not present

## 2020-07-14 DIAGNOSIS — Z6831 Body mass index (BMI) 31.0-31.9, adult: Secondary | ICD-10-CM

## 2020-07-14 DIAGNOSIS — Z1231 Encounter for screening mammogram for malignant neoplasm of breast: Secondary | ICD-10-CM | POA: Diagnosis not present

## 2020-07-14 MED ORDER — PHENTERMINE HCL 37.5 MG PO CAPS: 37.5000 mg | ORAL_CAPSULE | ORAL | 0 refills | Status: DC

## 2020-07-14 NOTE — Patient Instructions (Signed)
Follow-up as needed  Return in 1 year for physical Eat a heart healthy diet Exercise a minimum of 3 days a week/ 20 minutes a day   Health Maintenance, Female Adopting a healthy lifestyle and getting preventive care are important in promoting health and wellness. Ask your health care provider about:  The right schedule for you to have regular tests and exams.  Things you can do on your own to prevent diseases and keep yourself healthy. What should I know about diet, weight, and exercise? Eat a healthy diet   Eat a diet that includes plenty of vegetables, fruits, low-fat dairy products, and lean protein.  Do not eat a lot of foods that are high in solid fats, added sugars, or sodium. Maintain a healthy weight Body mass index (BMI) is used to identify weight problems. It estimates body fat based on height and weight. Your health care provider can help determine your BMI and help you achieve or maintain a healthy weight. Get regular exercise Get regular exercise. This is one of the most important things you can do for your health. Most adults should:  Exercise for at least 150 minutes each week. The exercise should increase your heart rate and make you sweat (moderate-intensity exercise).  Do strengthening exercises at least twice a week. This is in addition to the moderate-intensity exercise.  Spend less time sitting. Even light physical activity can be beneficial. Watch cholesterol and blood lipids Have your blood tested for lipids and cholesterol at 59 years of age, then have this test every 5 years. Have your cholesterol levels checked more often if:  Your lipid or cholesterol levels are high.  You are older than 59 years of age.  You are at high risk for heart disease. What should I know about cancer screening? Depending on your health history and family history, you may need to have cancer screening at various ages. This may include screening for:  Breast  cancer.  Cervical cancer.  Colorectal cancer.  Skin cancer.  Lung cancer. What should I know about heart disease, diabetes, and high blood pressure? Blood pressure and heart disease  High blood pressure causes heart disease and increases the risk of stroke. This is more likely to develop in people who have high blood pressure readings, are of African descent, or are overweight.  Have your blood pressure checked: ? Every 3-5 years if you are 41-55 years of age. ? Every year if you are 76 years old or older. Diabetes Have regular diabetes screenings. This checks your fasting blood sugar level. Have the screening done:  Once every three years after age 32 if you are at a normal weight and have a low risk for diabetes.  More often and at a younger age if you are overweight or have a high risk for diabetes. What should I know about preventing infection? Hepatitis B If you have a higher risk for hepatitis B, you should be screened for this virus. Talk with your health care provider to find out if you are at risk for hepatitis B infection. Hepatitis C Testing is recommended for:  Everyone born from [birth year] through 1965.  Anyone with known risk factors for hepatitis C. Sexually transmitted infections (STIs)  Get screened for STIs, including gonorrhea and chlamydia, if: ? You are sexually active and are younger than 59 years of age. ? You are older than 59 years of age and your health care provider tells you that you are at risk for this type  of infection. ? Your sexual activity has changed since you were last screened, and you are at increased risk for chlamydia or gonorrhea. Ask your health care provider if you are at risk.  Ask your health care provider about whether you are at high risk for HIV. Your health care provider may recommend a prescription medicine to help prevent HIV infection. If you choose to take medicine to prevent HIV, you should first get tested for HIV. You should  then be tested every 3 months for as long as you are taking the medicine. Pregnancy  If you are about to stop having your period (premenopausal) and you may become pregnant, seek counseling before you get pregnant.  Take 400 to 800 micrograms (mcg) of folic acid every day if you become pregnant.  Ask for birth control (contraception) if you want to prevent pregnancy. Osteoporosis and menopause Osteoporosis is a disease in which the bones lose minerals and strength with aging. This can result in bone fractures. If you are 70 years old or older, or if you are at risk for osteoporosis and fractures, ask your health care provider if you should:  Be screened for bone loss.  Take a calcium or vitamin D supplement to lower your risk of fractures.  Be given hormone replacement therapy (HRT) to treat symptoms of menopause. Follow these instructions at home: Lifestyle  Do not use any products that contain nicotine or tobacco, such as cigarettes, e-cigarettes, and chewing tobacco. If you need help quitting, ask your health care provider.  Do not use street drugs.  Do not share needles.  Ask your health care provider for help if you need support or information about quitting drugs. Alcohol use  Do not drink alcohol if: ? Your health care provider tells you not to drink. ? You are pregnant, may be pregnant, or are planning to become pregnant.  If you drink alcohol: ? Limit how much you use to 0-1 drink a day. ? Limit intake if you are breastfeeding.  Be aware of how much alcohol is in your drink. In the [place] one drink equals one 12 oz bottle of beer (355 mL), one 5 oz glass of wine (148 mL), or one 1 oz glass of hard liquor (44 mL). General instructions  Schedule regular health, dental, and eye exams.  Stay current with your vaccines.  Tell your health care provider if: ? You often feel depressed. ? You have ever been abused or do not feel safe at home. Summary  Adopting a  healthy lifestyle and getting preventive care are important in promoting health and wellness.  Follow your health care provider's instructions about healthy diet, exercising, and getting tested or screened for diseases.  Follow your health care provider's instructions on monitoring your cholesterol and blood pressure. This information is not intended to replace advice given to you by your health care provider. Make sure you discuss any questions you have with your health care provider. Document Revised: 09/05/2018 Document Reviewed: 09/05/2018 Elsevier Patient Education  2020 [employer] Preventive Care 64-75 Years Old, Female Preventive care refers to visits with your health care provider and lifestyle choices that can promote health and wellness. This includes:  A yearly physical exam. This may also be called an annual well check.  Regular dental visits and eye exams.  Immunizations.  Screening for certain conditions.  Healthy lifestyle choices, such as eating a healthy diet, getting regular exercise, not using drugs or products that contain nicotine and tobacco, and limiting  alcohol use. What can I expect for my preventive care visit? Physical exam Your health care provider will check your:  Height and weight. This may be used to calculate body mass index (BMI), which tells if you are at a healthy weight.  Heart rate and blood pressure.  Skin for abnormal spots. Counseling Your health care provider may ask you questions about your:  Alcohol, tobacco, and drug use.  Emotional well-being.  Home and relationship well-being.  Sexual activity.  Eating habits.  Work and work Statistician.  Method of birth control.  Menstrual cycle.  Pregnancy history. What immunizations do I need?  Influenza (flu) vaccine  This is recommended every year. Tetanus, diphtheria, and pertussis (Tdap) vaccine  You may need a Td booster every 10 years. Varicella (chickenpox)  vaccine  You may need this if you have not been vaccinated. Zoster (shingles) vaccine  You may need this after age 51. Measles, mumps, and rubella (MMR) vaccine  You may need at least one dose of MMR if you were born in 1957 or later. You may also need a second dose. Pneumococcal conjugate (PCV13) vaccine  You may need this if you have certain conditions and were not previously vaccinated. Pneumococcal polysaccharide (PPSV23) vaccine  You may need one or two doses if you smoke cigarettes or if you have certain conditions. Meningococcal conjugate (MenACWY) vaccine  You may need this if you have certain conditions. Hepatitis A vaccine  You may need this if you have certain conditions or if you travel or work in places where you may be exposed to hepatitis A. Hepatitis B vaccine  You may need this if you have certain conditions or if you travel or work in places where you may be exposed to hepatitis B. Haemophilus influenzae type b (Hib) vaccine  You may need this if you have certain conditions. Human papillomavirus (HPV) vaccine  If recommended by your health care provider, you may need three doses over 6 months. You may receive vaccines as individual doses or as more than one vaccine together in one shot (combination vaccines). Talk with your health care provider about the risks and benefits of combination vaccines. What tests do I need? Blood tests  Lipid and cholesterol levels. These may be checked every 5 years, or more frequently if you are over 48 years old.  Hepatitis C test.  Hepatitis B test. Screening  Lung cancer screening. You may have this screening every year starting at age 21 if you have a 30-pack-year history of smoking and currently smoke or have quit within the past 15 years.  Colorectal cancer screening. All adults should have this screening starting at age 38 and continuing until age 12. Your health care provider may recommend screening at age 52 if you  are at increased risk. You will have tests every 1-10 years, depending on your results and the type of screening test.  Diabetes screening. This is done by checking your blood sugar (glucose) after you have not eaten for a while (fasting). You may have this done every 1-3 years.  Mammogram. This may be done every 1-2 years. Talk with your health care provider about when you should start having regular mammograms. This may depend on whether you have a family history of breast cancer.  BRCA-related cancer screening. This may be done if you have a family history of breast, ovarian, tubal, or peritoneal cancers.  Pelvic exam and Pap test. This may be done every 3 years starting at age 49. Starting  at age 3, this may be done every 5 years if you have a Pap test in combination with an HPV test. Other tests  Sexually transmitted disease (STD) testing.  Bone density scan. This is done to screen for osteoporosis. You may have this scan if you are at high risk for osteoporosis. Follow these instructions at home: Eating and drinking  Eat a diet that includes fresh fruits and vegetables, whole grains, lean protein, and low-fat dairy.  Take vitamin and mineral supplements as recommended by your health care provider.  Do not drink alcohol if: ? Your health care provider tells you not to drink. ? You are pregnant, may be pregnant, or are planning to become pregnant.  If you drink alcohol: ? Limit how much you have to 0-1 drink a day. ? Be aware of how much alcohol is in your drink. In the [place] one drink equals one 12 oz bottle of beer (355 mL), one 5 oz glass of wine (148 mL), or one 1 oz glass of hard liquor (44 mL). Lifestyle  Take daily care of your teeth and gums.  Stay active. Exercise for at least 30 minutes on 5 or more days each week.  Do not use any products that contain nicotine or tobacco, such as cigarettes, e-cigarettes, and chewing tobacco. If you need help quitting, ask your  health care provider.  If you are sexually active, practice safe sex. Use a condom or other form of birth control (contraception) in order to prevent pregnancy and STIs (sexually transmitted infections).  If told by your health care provider, take low-dose aspirin daily starting at age 99. What's next?  Visit your health care provider once a year for a well check visit.  Ask your health care provider how often you should have your eyes and teeth checked.  Stay up to date on all vaccines. This information is not intended to replace advice given to you by your health care provider. Make sure you discuss any questions you have with your health care provider. Document Revised: 05/24/2018 Document Reviewed: 05/24/2018 Elsevier Patient Education  2020 [employer].

## 2020-07-14 NOTE — Progress Notes (Signed)
Subjective:  Patient ID: Vanessa Morse, female    DOB: 1960/11/12  Age: 59 y.o. MRN: [id number]  Chief Complaint  Patient presents with  . Annual Exam    HPI  Well Adult Physical: Vanessa Morse is a 59 year old Caucasian female here for a comprehensive physical exam.The patient reports no problems . She denies chronic medical problems. She is up-to-date on dental exams and colonoscopy screenings. She is overdue for eye-exam. She has an upcoming mammogram in December 2021. S History of right breast biopsy in the past that was benign. Pap smear not required due to abdominal hysterectomy. Vanessa Morse has fatigue. BMI today 30.24. She would like some help with weight management. She has declined the flu and COVID-19 vaccines today.  Do you take any herbs or supplements that were not prescribed by a doctor? yes Are you taking calcium supplements? no Are you taking aspirin daily? no  Encounter for general adult medical examination without abnormal findings  Physical ("At Risk" items are starred): Patient's last physical exam was 1 year ago .  Smoking: Life-long non-smoker ;  Physical Activity: Exercises at least 3 times per week ;  Alcohol/Drug Use: Is a non-drinker ; No illicit drug use ;  Patient is not afflicted from Stress Incontinence and Urge Incontinence  Safety: reviewed. Patient wears a seat belt, has smoke detectors, has carbon monoxide detectors, practices appropriate gun safety, and wears sunscreen with extended sun exposure. Dental Care: biannual cleanings, brushes and flosses daily. Ophthalmology/Optometry: Annual visit.  Hearing loss: none Vision impairment: Wears glasses and contacts            Social Hx   Social History   Socioeconomic History  . Marital status: Divorced    Spouse name: Not on file  . Number of children: Not on file  . Years of education: Not on file  . Highest education level: Not on file  Occupational History  . Not on file  Tobacco Use  . Smoking status:  Never Smoker  . Smokeless tobacco: Never Used  Vaping Use  . Vaping Use: Never used  Substance and Sexual Activity  . Alcohol use: Yes    Comment: Drinks on a social basis only.  . Drug use: Never  . Sexual activity: Not on file  Other Topics Concern  . Not on file  Social History Narrative  . Not on file   Social Determinants of Health   Financial Resource Strain:   . Difficulty of Paying Living Expenses: Not on file  Food Insecurity:   . Worried About Programme researcher, broadcasting/film/video in the Last Year: Not on file  . Ran Out of Food in the Last Year: Not on file  Transportation Needs:   . Lack of Transportation (Medical): Not on file  . Lack of Transportation (Non-Medical): Not on file  Physical Activity:   . Days of Exercise per Week: Not on file  . Minutes of Exercise per Session: Not on file  Stress:   . Feeling of Stress : Not on file  Social Connections:   . Frequency of Communication with Friends and Family: Not on file  . Frequency of Social Gatherings with Friends and Family: Not on file  . Attends Religious Services: Not on file  . Active Member of Clubs or Organizations: Not on file  . Attends Banker Meetings: Not on file  . Marital Status: Not on file   Past Medical History:  Diagnosis Date  . Abnormal mammogram of right breast   .  Acquired absence of both cervix and uterus   . Mixed hyperlipidemia    Past Surgical History:  Procedure Laterality Date  . ABDOMINAL HYSTERECTOMY    . RADIOACTIVE SEED GUIDED EXCISIONAL BREAST BIOPSY Right 11/27/2018   Procedure: RIGHT RADIOACTIVE SEED GUIDED EXCISIONAL BREAST BIOPSY ERAS PATHWAY;  Surgeon: Almond Lint, MD;  Location: [facility] OR;  Service: General;  Laterality: Right;  . TONSILLECTOMY      Family History  Problem Relation Age of Onset  . Arrhythmia Mother   . Rheum arthritis Mother   . Hypertension Father   . Alzheimer's disease Father   . Hypertension Sister   . Hypertension Brother   . Heart attack  Paternal Grandfather     Review of Systems  Constitutional: Negative for fatigue.  HENT: Negative for congestion, ear pain and sore throat.   Respiratory: Negative for cough and shortness of breath.   Cardiovascular: Negative for chest pain.  Gastrointestinal: Negative for abdominal pain, constipation, diarrhea, nausea and vomiting.  Genitourinary: Negative for dysuria, frequency and urgency.  Musculoskeletal: Negative for arthralgias, back pain and myalgias.  Neurological: Negative for dizziness and headaches.  Psychiatric/Behavioral: Negative for agitation and sleep disturbance. The patient is not nervous/anxious.      Objective:  BP 132/80 (BP Location: Right Arm, Patient Position: Sitting, Cuff Size: Normal)   Pulse 81   Temp (!) 97.4 F (36.3 C) (Temporal)   Ht 5' 2.5" (1.588 m)   Wt 175 lb 6.4 oz (79.6 kg)   SpO2 99%   BMI 31.57 kg/m   BP/Weight 07/14/2020 11/27/2018 11/20/2018  Systolic BP 132 134 135  Diastolic BP 80 73 68  Wt. (Lbs) 175.4 172 177.5  BMI 31.57 29.52 30    Physical Exam Vitals reviewed.  Constitutional:      Appearance: Normal appearance.  Cardiovascular:     Rate and Rhythm: Normal rate and regular rhythm.  Pulmonary:     Effort: Pulmonary effort is normal.     Breath sounds: Normal breath sounds.  Abdominal:     General: Bowel sounds are normal.  Musculoskeletal:        General: Normal range of motion.     Cervical back: Normal range of motion.  Skin:    General: Skin is warm.  Neurological:     Mental Status: She is alert.  Psychiatric:        Mood and Affect: Mood normal.        Behavior: Behavior normal.     Lab Results  Component Value Date   WBC 5.1 11/20/2018   HGB 13.5 11/20/2018   HCT 40.9 11/20/2018   PLT 195 11/20/2018   GLUCOSE 104 (H) 11/20/2018   NA 139 11/20/2018   K 4.3 11/20/2018   CL 105 11/20/2018   CREATININE 1.15 (H) 11/20/2018   BUN 12 11/20/2018   CO2 25 11/20/2018      Assessment & Plan:  1.  Encounter for general adult medical examination without abnormal findings - CBC With Diff/Platelet - Comprehensive metabolic panel - Lipid panel - TSH  2. Encounter for screening mammogram for malignant neoplasm of breast - [facility] DIGITAL SCREENING BILATERAL    3. Body mass index is 31.57 kg/m.  - Phentermine 37.5mg  daily  These are the goals we discussed: Goals   Consume heart healthy diet Increase exercise to 20 minutes daily, 3 times/week     This is a list of the screening recommended for you and due dates:  Health Maintenance  Topic Date Due  .  Hepatitis C: One time screening is recommended by Center for Disease Control  (CDC) for  adults born from [birth year] through 1965.   Never done  . HIV Screening  Never done  . Tetanus Vaccine  Never done  . Flu Shot  Never done  . Mammogram  09/08/2020  . Colon Cancer Screening  10/17/2024  . Pap Smear  Discontinued     AN INDIVIDUALIZED CARE PLAN: was established or reinforced today.   SELF MANAGEMENT: The patient and I together assessed ways to personally work towards obtaining the recommended goals  Support needs The patient and/or family needs were assessed and services were offered if appropriate.    Follow-up: Return in 110-month for f/u weight management medication  An After Visit Summary was printed and given to the patient.  Flonnie Hailstone, DNP [facility] [phone]

## 2020-07-15 LAB — COMPREHENSIVE METABOLIC PANEL
ALT: 23 IU/L (ref 0–32)
AST: 20 IU/L (ref 0–40)
Albumin/Globulin Ratio: 1.8 (ref 1.2–2.2)
Albumin: 4.6 g/dL (ref 3.8–4.9)
Alkaline Phosphatase: 106 IU/L (ref 44–121)
BUN/Creatinine Ratio: 14 (ref 9–23)
BUN: 12 mg/dL (ref 6–24)
Bilirubin Total: 0.5 mg/dL (ref 0.0–1.2)
CO2: 24 mmol/L (ref 20–29)
Calcium: 10 mg/dL (ref 8.7–10.2)
Chloride: 102 mmol/L (ref 96–106)
Creatinine, Ser: 0.84 mg/dL (ref 0.57–1.00)
GFR calc Af Amer: 89 mL/min/{1.73_m2} (ref >59)
GFR calc non Af Amer: 77 mL/min/{1.73_m2} (ref >59)
Globulin, Total: 2.6 g/dL (ref 1.5–4.5)
Glucose: 99 mg/dL (ref 65–99)
Potassium: 4.8 mmol/L (ref 3.5–5.2)
Sodium: 139 mmol/L (ref 134–144)
Total Protein: 7.2 g/dL (ref 6.0–8.5)

## 2020-07-15 LAB — CBC WITH DIFF/PLATELET
Basophils Absolute: 0.1 10*3/uL (ref 0.0–0.2)
Basos: 1 %
EOS (ABSOLUTE): 0.2 10*3/uL (ref 0.0–0.4)
Eos: 4 %
Hematocrit: 42.6 % (ref 34.0–46.6)
Hemoglobin: 14.3 g/dL (ref 11.1–15.9)
Immature Grans (Abs): 0 10*3/uL (ref 0.0–0.1)
Immature Granulocytes: 0 %
Lymphocytes Absolute: 1.3 10*3/uL (ref 0.7–3.1)
Lymphs: 28 %
MCH: 28.9 pg (ref 26.6–33.0)
MCHC: 33.6 g/dL (ref 31.5–35.7)
MCV: 86 fL (ref 79–97)
Monocytes Absolute: 0.6 10*3/uL (ref 0.1–0.9)
Monocytes: 12 %
Neutrophils Absolute: 2.5 10*3/uL (ref 1.4–7.0)
Neutrophils: 55 %
Platelets: 212 10*3/uL (ref 150–450)
RBC: 4.95 x10E6/uL (ref 3.77–5.28)
RDW: 12.5 % (ref 11.7–15.4)
WBC: 4.5 10*3/uL (ref 3.4–10.8)

## 2020-07-15 LAB — LIPID PANEL
Chol/HDL Ratio: 4 ratio (ref 0.0–4.4)
Cholesterol, Total: 219 mg/dL — ABNORMAL HIGH (ref 100–199)
HDL: 55 mg/dL (ref >39)
LDL Chol Calc (NIH): 148 mg/dL — ABNORMAL HIGH (ref 0–99)
Triglycerides: 91 mg/dL (ref 0–149)
VLDL Cholesterol Cal: 16 mg/dL (ref 5–40)

## 2020-07-15 LAB — CARDIOVASCULAR RISK ASSESSMENT

## 2020-07-15 LAB — TSH: TSH: 1.33 u[IU]/mL (ref 0.450–4.500)

## 2020-07-15 NOTE — Progress Notes (Signed)
CBC: normal; CMP: normal; TSH: 1.330, normal Lipid panel slightly elevated TC 219, Trig 91, HDL 55, LDL 148 Decrease fried/fatty foods and increase fresh fruit, vegetables, and fiber in diet

## 2020-07-20 IMAGING — MG BREAST SURGICAL SPECIMEN
1 series · 1 of 1 positions shown · non-contrast
Comparison: Previous exam(s).

CLINICAL DATA: Status post seed localized RIGHT lumpectomy.

EXAM:
SPECIMEN RADIOGRAPH OF THE RIGHT BREAST

[R]
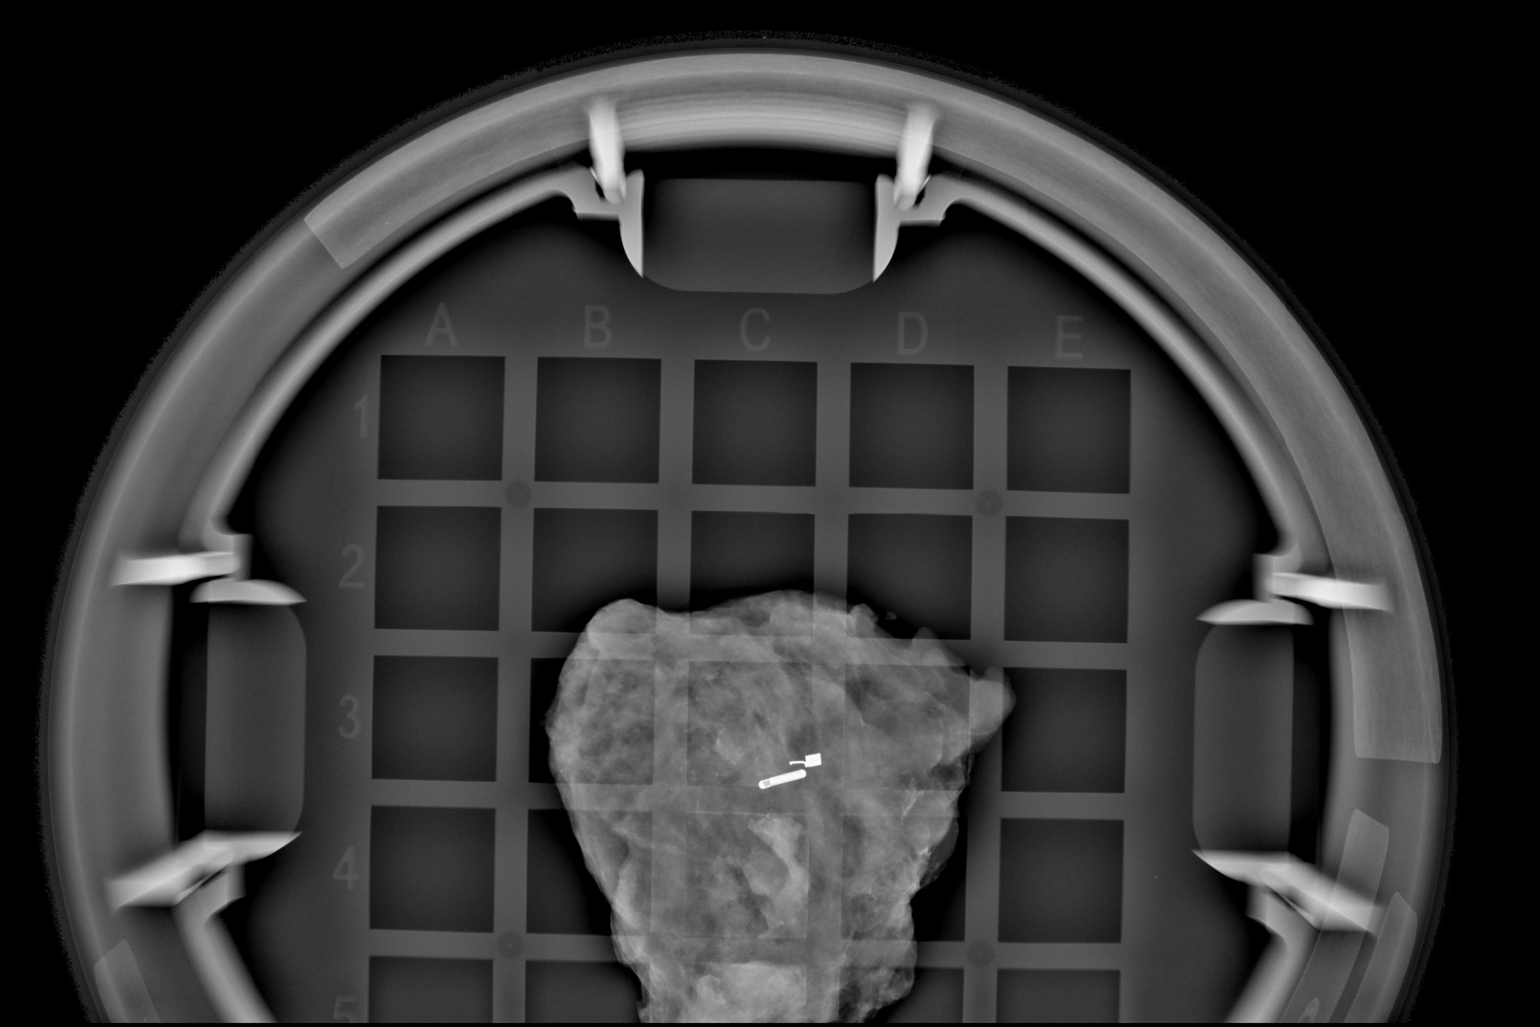

[1 of 1 positions shown; findings below may reference images not displayed]

FINDINGS: Status post excision of the right breast. The radioactive seed and
biopsy marker clip are present, completely intact, and were marked
for pathology.
IMPRESSION: Specimen radiograph of the right breast.

## 2020-07-23 ENCOUNTER — Other Ambulatory Visit: Payer: Managed Care, Other (non HMO)

## 2020-07-23 ENCOUNTER — Other Ambulatory Visit: Payer: Self-pay | Admitting: Nurse Practitioner

## 2020-07-23 DIAGNOSIS — Z0289 Encounter for other administrative examinations: Secondary | ICD-10-CM

## 2020-07-24 LAB — HEMOGLOBIN A1C
Est. average glucose Bld gHb Est-mCnc: 100 mg/dL
Hgb A1c MFr Bld: 5.1 % (ref 4.8–5.6)

## 2020-07-27 NOTE — Progress Notes (Signed)
HgbA1C normal at 5.1

## 2020-09-10 LAB — HM MAMMOGRAPHY

## 2020-09-15 ENCOUNTER — Encounter: Payer: Self-pay | Admitting: Nurse Practitioner

## 2020-09-15 ENCOUNTER — Telehealth (INDEPENDENT_AMBULATORY_CARE_PROVIDER_SITE_OTHER): Payer: Managed Care, Other (non HMO) | Admitting: Nurse Practitioner

## 2020-09-15 VITALS — Ht 63.0 in | Wt 175.0 lb

## 2020-09-15 DIAGNOSIS — J0110 Acute frontal sinusitis, unspecified: Secondary | ICD-10-CM

## 2020-09-15 MED ORDER — FLUTICASONE PROPIONATE 50 MCG/ACT NA SUSP: 2.0000 | Freq: Every day | NASAL | 6 refills | Status: DC

## 2020-09-15 MED ORDER — AZITHROMYCIN 250 MG PO TABS: ORAL_TABLET | ORAL | 0 refills | Status: DC

## 2020-09-15 MED ORDER — LORATADINE 10 MG PO TABS: 10.0000 mg | ORAL_TABLET | Freq: Every day | ORAL | 0 refills | Status: DC

## 2020-09-15 NOTE — Progress Notes (Signed)
Virtual Visit via Telephone Note   This visit type was conducted due to national recommendations for restrictions regarding the COVID-19 Pandemic (e.g. social distancing) in an effort to limit this patient's exposure and mitigate transmission in our community.  Due to her co-morbid illnesses, this patient is at least at moderate risk for complications without adequate follow up.  This format is felt to be most appropriate for this patient at this time.  The patient did not have access to video technology/had technical difficulties with video requiring transitioning to audio format only (telephone).  All issues noted in this document were discussed and addressed.  No physical exam could be performed with this format.  Patient verbally consented to a telehealth visit.   Date:  09/15/2020   ID:  Alanson Puls, DOB [birth year], MRN [id number]  Patient Location: Home Provider Location: Office/Clinic  PCP:  Janie Morning, NP   Evaluation Performed:  Established patient, acute telemedicine visit  Chief Complaint: Sinus congestion  History of Present Illness:    Myriam Brandhorst is a 59 y.o. female with sore throat, cough, sinus congestion/pain, frontal sinus tenderness, and eye pain. Onset of symptoms was 4-days ago. She denies fever, body aches, or rash. Treatment includes Mucinex-DM, rest, and pushing fluids. She states symptoms have not improved with treatment. She had negative COVID-19 test at her place of employment yesterday.   The patient does have symptoms concerning for COVID-19 infection (fever, chills, cough, or new shortness of breath).    Past Medical History:  Diagnosis Date   Abnormal mammogram of right breast    Acquired absence of both cervix and uterus    Mixed hyperlipidemia     Past Surgical History:  Procedure Laterality Date   ABDOMINAL HYSTERECTOMY     RADIOACTIVE SEED GUIDED EXCISIONAL BREAST BIOPSY Right 11/27/2018   Procedure: RIGHT RADIOACTIVE SEED GUIDED  EXCISIONAL BREAST BIOPSY ERAS PATHWAY;  Surgeon: Almond Lint, MD;  Location: [facility] OR;  Service: General;  Laterality: Right;   TONSILLECTOMY      Family History  Problem Relation Age of Onset   Arrhythmia Mother    Rheum arthritis Mother    Hypertension Father    Alzheimer's disease Father    Hypertension Sister    Hypertension Brother    Heart attack Paternal Grandfather     Social History   Socioeconomic History   Marital status: Divorced    Spouse name: Not on file   Number of children: Not on file   Years of education: Not on file   Highest education level: Not on file  Occupational History   Not on file  Tobacco Use   Smoking status: Never Smoker   Smokeless tobacco: Never Used  Vaping Use   Vaping Use: Never used  Substance and Sexual Activity   Alcohol use: Yes    Comment: Drinks on a social basis only.   Drug use: Never   Sexual activity: Not on file  Other Topics Concern   Not on file  Social History Narrative   Not on file   Social Determinants of Health   Financial Resource Strain: Not on file  Food Insecurity: Not on file  Transportation Needs: Not on file  Physical Activity: Not on file  Stress: Not on file  Social Connections: Not on file  Intimate Partner Violence: Not on file    Outpatient Medications Prior to Visit  Medication Sig Dispense Refill   acetaminophen (TYLENOL) 500 MG tablet Take 500 mg by mouth every 6 (  six) hours as needed.     Apple Cider Vinegar 500 MG TABS Take 500 mg by mouth daily.     Ascorbic Acid (VITAMIN C) 1000 MG tablet Take 1,000 mg by mouth daily.     Biotin 2500 MCG CAPS Take 2,500 mcg by mouth daily.     diphenhydramine-acetaminophen (TYLENOL PM) 25-500 MG TABS tablet Take 1 tablet by mouth at bedtime as needed.     Multiple Vitamins-Minerals (MULTIVITAMIN WITH MINERALS) tablet Take 1 tablet by mouth daily.     phentermine 37.5 MG capsule Take 1 capsule (37.5 mg total) by mouth every  morning. 30 capsule 0   No facility-administered medications prior to visit.    Allergies:   Codeine   Social History   Tobacco Use   Smoking status: Never Smoker   Smokeless tobacco: Never Used  Vaping Use   Vaping Use: Never used  Substance Use Topics   Alcohol use: Yes    Comment: Drinks on a social basis only.   Drug use: Never     Review of Systems  Constitutional: Negative for chills, fever and malaise/fatigue.  HENT: Positive for congestion, sinus pain and sore throat. Negative for ear pain.        Post-nasal drip and frontal sinus tenderness  Eyes: Positive for pain (bilateral).  Respiratory: Positive for cough and sputum production. Negative for shortness of breath and wheezing.   Cardiovascular: Negative for chest pain, palpitations, orthopnea and leg swelling.  Gastrointestinal: Negative for abdominal pain, diarrhea, heartburn, nausea and vomiting.  Genitourinary: Negative for frequency and urgency.  Musculoskeletal: Negative for back pain, joint pain and myalgias.  Skin: Negative for rash.  Neurological: Positive for headaches. Negative for dizziness.     Labs/Other Tests and Data Reviewed:    Recent Labs: 07/14/2020: ALT 23; BUN 12; Creatinine, Ser 0.84; Hemoglobin 14.3; Platelets 212; Potassium 4.8; Sodium 139; TSH 1.330   Recent Lipid Panel Lab Results  Component Value Date/Time   CHOL 219 (H) 07/14/2020 08:32 AM   TRIG 91 07/14/2020 08:32 AM   HDL 55 07/14/2020 08:32 AM   CHOLHDL 4.0 07/14/2020 08:32 AM   LDLCALC 148 (H) 07/14/2020 08:32 AM    Wt Readings from Last 3 Encounters:  09/15/20 175 lb (79.4 kg)  07/14/20 175 lb 6.4 oz (79.6 kg)  11/27/18 172 lb (78 kg)     Objective:    Vital Signs:  Ht 5\' 3"  (1.6 m)    Wt 175 lb (79.4 kg)    BMI 31.00 kg/m    Physical Exam Vitals reviewed.    No physical exam performed due to telemedicine  ASSESSMENT & PLAN:      1. Acute non-recurrent frontal sinusitis - azithromycin (ZITHROMAX)  250 MG tablet; Take two tablets by mouth on day one, take one tablet by mouth on day two-five  Dispense: 6 tablet; Refill: 0 - fluticasone (FLONASE) 50 MCG/ACT nasal spray; Place 2 sprays into both nostrils daily.  Dispense: 16 g; Refill: 6 - loratadine (CLARITIN) 10 MG tablet; Take 1 tablet (10 mg total) by mouth daily.  Dispense: 90 tablet; Refill: 0    Rest and push fluids Notify office if symptoms fail to improve or worsen   COVID-19 Education: The signs and symptoms of COVID-19 were discussed with the patient and how to seek care for testing (follow up with PCP or arrange E-visit). The importance of social distancing was discussed today.   Telephone call: 10:14-10:19  I spent 15 minutes dedicated to the care  of this patient on the date of this encounter to include telephone time with the patient, as well as chart review and prescription management.   Follow Up:  Virtual Visit  prn  Signed,  Flonnie Hailstone, DNP  09/15/2020 10:39    [facility]

## 2021-07-15 ENCOUNTER — Other Ambulatory Visit: Payer: Self-pay

## 2021-07-15 ENCOUNTER — Encounter: Payer: Self-pay | Admitting: Nurse Practitioner

## 2021-07-15 ENCOUNTER — Other Ambulatory Visit: Payer: Self-pay | Admitting: Nurse Practitioner

## 2021-07-15 ENCOUNTER — Ambulatory Visit (INDEPENDENT_AMBULATORY_CARE_PROVIDER_SITE_OTHER): Payer: Managed Care, Other (non HMO) | Admitting: Nurse Practitioner

## 2021-07-15 VITALS — BP 134/80 | HR 77 | Temp 97.4°F | Resp 18 | Ht 64.0 in | Wt 179.6 lb

## 2021-07-15 DIAGNOSIS — J0110 Acute frontal sinusitis, unspecified: Secondary | ICD-10-CM

## 2021-07-15 DIAGNOSIS — J302 Other seasonal allergic rhinitis: Secondary | ICD-10-CM

## 2021-07-15 DIAGNOSIS — Z0289 Encounter for other administrative examinations: Secondary | ICD-10-CM

## 2021-07-15 MED ORDER — FLUTICASONE PROPIONATE 50 MCG/ACT NA SUSP: 2.0000 | Freq: Every day | NASAL | 6 refills | Status: AC

## 2021-07-15 NOTE — Progress Notes (Addendum)
Subjective:  Patient ID: Vanessa Morse, female    DOB: 1960-12-24  Age: 60 y.o. MRN: [id number]  Chief Complaint  Patient presents with   Annual Exam    HPI Vanessa Morse is a 60 year old Caucasian female that presents for annual physical exam.   Well Adult Physical: Patient here for a comprehensive physical exam.The patient reports  allergic rhinitis Do you take any herbs or supplements that were not prescribed by a doctor? Yes Are you taking calcium supplements? yes Are you taking aspirin daily? no  Encounter for general adult medical examination without abnormal findings  Physical ("At Risk" items are starred): Patient's last physical exam was 1 year ago .  Patient is not afflicted from Stress Incontinence and Urge Incontinence  Patient wears a seat belt, has smoke detectors, has carbon monoxide detectors, practices appropriate gun safety, and wears sunscreen with extended sun exposure. Dental Care: biannual cleanings, brushes and flosses daily. Ophthalmology/Optometry: Annual visit.  Hearing loss: none Vision impairments: wears contact lenses  Menarche: age 37 Menstrual History: hysterectomy 9 years ago LMP: unknown Pregnancy history: none Safe at home: yes Self breast exams: no  Flowsheet Row Office Visit from 07/14/2020 in [facility]  PHQ-2 Total Score 0       Social Hx   Social History   Socioeconomic History   Marital status: Divorced    Spouse name: Not on file   Number of children: Not on file   Years of education: Not on file   Highest education level: Not on file  Occupational History   Not on file  Tobacco Use   Smoking status: Never   Smokeless tobacco: Never  Vaping Use   Vaping Use: Never used  Substance and Sexual Activity   Alcohol use: Yes    Comment: Drinks on a social basis only.   Drug use: Never   Sexual activity: Not on file  Other Topics Concern   Not on file  Social History Narrative   Not on file   Social Determinants of Health    Financial Resource Strain: Not on file  Food Insecurity: Not on file  Transportation Needs: Not on file  Physical Activity: Not on file  Stress: Not on file  Social Connections: Not on file   Past Medical History:  Diagnosis Date   Abnormal mammogram of right breast    Acquired absence of both cervix and uterus    Mixed hyperlipidemia    Past Surgical History:  Procedure Laterality Date   ABDOMINAL HYSTERECTOMY     RADIOACTIVE SEED GUIDED EXCISIONAL BREAST BIOPSY Right 11/27/2018   Procedure: RIGHT RADIOACTIVE SEED GUIDED EXCISIONAL BREAST BIOPSY ERAS PATHWAY;  Surgeon: Almond Lint, MD;  Location: [facility] OR;  Service: General;  Laterality: Right;   TONSILLECTOMY      Family History  Problem Relation Age of Onset   Arrhythmia Mother    Rheum arthritis Mother    Hypertension Father    Alzheimer's disease Father    Hypertension Sister    Hypertension Brother    Heart attack Paternal Grandfather     Review of Systems  Constitutional:  Negative for appetite change, fatigue and unexpected weight change.  HENT:  Positive for postnasal drip and sore throat. Negative for congestion, ear pain, rhinorrhea, sinus pressure, sinus pain and tinnitus.   Eyes:  Negative for pain.  Respiratory:  Negative for cough and shortness of breath.   Cardiovascular:  Negative for chest pain, palpitations and leg swelling.  Gastrointestinal:  Negative for abdominal  pain, constipation, diarrhea, nausea and vomiting.  Endocrine: Negative for cold intolerance, heat intolerance, polydipsia, polyphagia and polyuria.  Genitourinary:  Negative for dysuria, frequency and hematuria.  Musculoskeletal:  Positive for arthralgias (bilateral knees). Negative for back pain, joint swelling and myalgias.  Skin:  Negative for rash.  Allergic/Immunologic: Positive for environmental allergies.  Neurological:  Negative for dizziness and headaches.  Hematological:  Negative for adenopathy.  Psychiatric/Behavioral:   Negative for decreased concentration and sleep disturbance. The patient is not nervous/anxious.     Objective:  BP 134/80   Pulse 77   Temp (!) 97.4 F (36.3 C)   Resp 18   Ht 5\' 4"  (1.626 m)   Wt 179 lb 9.6 oz (81.5 kg)   SpO2 99%   BMI 30.83 kg/m   BP/Weight 07/15/2021 09/15/2020 07/14/2020  Systolic BP 134 - 132  Diastolic BP 80 - 80  Wt. (Lbs) 179.6 175 175.4  BMI 30.83 31 31.57    Physical Exam Vitals reviewed.  Constitutional:      Appearance: Normal appearance.  HENT:     Head: Normocephalic.     Right Ear: Tympanic membrane normal.     Left Ear: Tympanic membrane normal.     Nose: Nose normal.     Mouth/Throat:     Mouth: Mucous membranes are moist.     Pharynx: Posterior oropharyngeal erythema present.  Eyes:     Pupils: Pupils are equal, round, and reactive to light.  Cardiovascular:     Rate and Rhythm: Normal rate and regular rhythm.     Pulses: Normal pulses.     Heart sounds: Normal heart sounds.  Pulmonary:     Effort: Pulmonary effort is normal.     Breath sounds: Normal breath sounds.  Abdominal:     General: Bowel sounds are normal.     Palpations: Abdomen is soft.  Musculoskeletal:        General: Normal range of motion.     Cervical back: Neck supple.  Skin:    General: Skin is warm and dry.     Capillary Refill: Capillary refill takes less than 2 seconds.  Neurological:     General: No focal deficit present.     Mental Status: She is alert and oriented to person, place, and time.  Psychiatric:        Mood and Affect: Mood normal.        Behavior: Behavior normal.    Lab Results  Component Value Date   WBC 4.5 07/14/2020   HGB 14.3 07/14/2020   HCT 42.6 07/14/2020   PLT 212 07/14/2020   GLUCOSE 99 07/14/2020   CHOL 219 (H) 07/14/2020   TRIG 91 07/14/2020   HDL 55 07/14/2020   LDLCALC 148 (H) 07/14/2020   ALT 23 07/14/2020   AST 20 07/14/2020   NA 139 07/14/2020   K 4.8 07/14/2020   CL 102 07/14/2020   CREATININE 0.84  07/14/2020   BUN 12 07/14/2020   CO2 24 07/14/2020   TSH 1.330 07/14/2020   HGBA1C 5.1 07/23/2020      Assessment & Plan:   1. Encounter for physical examination related to employment - CBC with Differential/Platelet - Comprehensive metabolic panel - Lipid panel - TSH - VITAMIN D 25 Hydroxy (Vit-D Deficiency, Fractures) - Hemoglobin A1c  2. Seasonal allergic rhinitis, unspecified trigger - fluticasone (FLONASE) 50 MCG/ACT nasal spray; Place 2 sprays into both nostrils daily.  Dispense: 16 g; Refill: 6     Body mass index  is 30.83 kg/m.   These are the goals we discussed:  Goals   Be alive in 1 year      This is a list of the screening recommended for you and due dates:  Health Maintenance  Topic Date Due   HIV Screening  Never done   Hepatitis C Screening: USPSTF Recommendation to screen - Ages 20-79 yo.  Never done   Tetanus Vaccine  Never done   Zoster (Shingles) Vaccine (1 of 2) Never done   Mammogram  09/08/2020   Colon Cancer Screening  10/17/2024   Pneumococcal Vaccination  Aged Out   HPV Vaccine  Aged Out   Flu Shot  Discontinued   Pap Smear  Discontinued   COVID-19 Vaccine  Discontinued    I,Victoria C Greene,acting as a Neurosurgeon for [employer], NP.,have documented all relevant documentation on the behalf of Janie Morning, NP,as directed by  Janie Morning, NP while in the presence of Janie Morning, NP.   I, Janie Morning, NP, have reviewed all documentation for this visit. The documentation on 07/15/21 for the exam, diagnosis, procedures, and orders are all accurate and complete.   Follow-up: 1-year  An After Visit Summary was printed and given to the patient.  Janie Morning, NP [facility] [phone]

## 2021-07-15 NOTE — Patient Instructions (Addendum)
Continue Claritin 10 mg daily Take Flonase spray daily We will call you with lab results and mammogram appointment Follow-up in 1-year   Preventive Care 79-60 Years Old, Female Preventive care refers to lifestyle choices and visits with your health care provider that can promote health and wellness. This includes: A yearly physical exam. This is also called an annual wellness visit. Regular dental and eye exams. Immunizations. Screening for certain conditions. Healthy lifestyle choices, such as: Eating a healthy diet. Getting regular exercise. Not using drugs or products that contain nicotine and tobacco. Limiting alcohol use. What can I expect for my preventive care visit? Physical exam Your health care provider will check your: Height and weight. These may be used to calculate your BMI (body mass index). BMI is a measurement that tells if you are at a healthy weight. Heart rate and blood pressure. Body temperature. Skin for abnormal spots. Counseling Your health care provider may ask you questions about your: Past medical problems. Family's medical history. Alcohol, tobacco, and drug use. Emotional well-being. Home life and relationship well-being. Sexual activity. Diet, exercise, and sleep habits. Work and work Statistician. Access to firearms. Method of birth control. Menstrual cycle. Pregnancy history. What immunizations do I need? Vaccines are usually given at various ages, according to a schedule. Your health care provider will recommend vaccines for you based on your age, medical history, and lifestyle or other factors, such as travel or where you work. What tests do I need? Blood tests Lipid and cholesterol levels. These may be checked every 5 years, or more often if you are over 60 years old. Hepatitis C test. Hepatitis B test. Screening Lung cancer screening. You may have this screening every year starting at age 60 if you have a 30-pack-year history of smoking  and currently smoke or have quit within the past 15 years. Colorectal cancer screening. All adults should have this screening starting at age 60 and continuing until age 40. Your health care provider may recommend screening at age 51 if you are at increased risk. You will have tests every 1-10 years, depending on your results and the type of screening test. Diabetes screening. This is done by checking your blood sugar (glucose) after you have not eaten for a while (fasting). You may have this done every 1-3 years. Mammogram. This may be done every 1-2 years. Talk with your health care provider about when you should start having regular mammograms. This may depend on whether you have a family history of breast cancer. BRCA-related cancer screening. This may be done if you have a family history of breast, ovarian, tubal, or peritoneal cancers. Pelvic exam and Pap test. This may be done every 3 years starting at age 60. Starting at age 60, this may be done every 5 years if you have a Pap test in combination with an HPV test. Other tests STD (sexually transmitted disease) testing, if you are at risk. Bone density scan. This is done to screen for osteoporosis. You may have this scan if you are at high risk for osteoporosis. Talk with your health care provider about your test results, treatment options, and if necessary, the need for more tests. Follow these instructions at home: Eating and drinking  Eat a diet that includes fresh fruits and vegetables, whole grains, lean protein, and low-fat dairy products. Take vitamin and mineral supplements as recommended by your health care provider. Do not drink alcohol if: Your health care provider tells you not to drink. You are pregnant,  may be pregnant, or are planning to become pregnant. If you drink alcohol: Limit how much you have to 0-1 drink a day. Be aware of how much alcohol is in your drink. In the [place] one drink equals one 12 oz bottle of  beer (355 mL), one 5 oz glass of wine (148 mL), or one 1 oz glass of hard liquor (44 mL). Lifestyle Take daily care of your teeth and gums. Brush your teeth every morning and night with fluoride toothpaste. Floss one time each day. Stay active. Exercise for at least 30 minutes 5 or more days each week. Do not use any products that contain nicotine or tobacco, such as cigarettes, e-cigarettes, and chewing tobacco. If you need help quitting, ask your health care provider. Do not use drugs. If you are sexually active, practice safe sex. Use a condom or other form of protection to prevent STIs (sexually transmitted infections). If you do not wish to become pregnant, use a form of birth control. If you plan to become pregnant, see your health care provider for a prepregnancy visit. If told by your health care provider, take low-dose aspirin daily starting at age 60. Find healthy ways to cope with stress, such as: Meditation, yoga, or listening to music. Journaling. Talking to a trusted person. Spending time with friends and family. Safety Always wear your seat belt while driving or riding in a vehicle. Do not drive: If you have been drinking alcohol. Do not ride with someone who has been drinking. When you are tired or distracted. While texting. Wear a helmet and other protective equipment during sports activities. If you have firearms in your house, make sure you follow all gun safety procedures. What's next? Visit your health care provider once a year for an annual wellness visit. Ask your health care provider how often you should have your eyes and teeth checked. Stay up to date on all vaccines. This information is not intended to replace advice given to you by your health care provider. Make sure you discuss any questions you have with your health care provider. Document Revised: 11/20/2020 Document Reviewed: 05/24/2018 Elsevier Patient Education  [employer] Maintenance,  Female Adopting a healthy lifestyle and getting preventive care are important in promoting health and wellness. Ask your health care provider about: The right schedule for you to have regular tests and exams. Things you can do on your own to prevent diseases and keep yourself healthy. What should I know about diet, weight, and exercise? Eat a healthy diet  Eat a diet that includes plenty of vegetables, fruits, low-fat dairy products, and lean protein. Do not eat a lot of foods that are high in solid fats, added sugars, or sodium. Maintain a healthy weight Body mass index (BMI) is used to identify weight problems. It estimates body fat based on height and weight. Your health care provider can help determine your BMI and help you achieve or maintain a healthy weight. Get regular exercise Get regular exercise. This is one of the most important things you can do for your health. Most adults should: Exercise for at least 150 minutes each week. The exercise should increase your heart rate and make you sweat (moderate-intensity exercise). Do strengthening exercises at least twice a week. This is in addition to the moderate-intensity exercise. Spend less time sitting. Even light physical activity can be beneficial. Watch cholesterol and blood lipids Have your blood tested for lipids and cholesterol at 60 years of age, then have this  test every 5 years. Have your cholesterol levels checked more often if: Your lipid or cholesterol levels are high. You are older than 60 years of age. You are at high risk for heart disease. What should I know about cancer screening? Depending on your health history and family history, you may need to have cancer screening at various ages. This may include screening for: Breast cancer. Cervical cancer. Colorectal cancer. Skin cancer. Lung cancer. What should I know about heart disease, diabetes, and high blood pressure? Blood pressure and heart disease High blood  pressure causes heart disease and increases the risk of stroke. This is more likely to develop in people who have high blood pressure readings, are of African descent, or are overweight. Have your blood pressure checked: Every 3-5 years if you are 78-79 years of age. Every year if you are 74 years old or older. Diabetes Have regular diabetes screenings. This checks your fasting blood sugar level. Have the screening done: Once every three years after age 45 if you are at a normal weight and have a low risk for diabetes. More often and at a younger age if you are overweight or have a high risk for diabetes. What should I know about preventing infection? Hepatitis B If you have a higher risk for hepatitis B, you should be screened for this virus. Talk with your health care provider to find out if you are at risk for hepatitis B infection. Hepatitis C Testing is recommended for: Everyone born from [birth year] through 1965. Anyone with known risk factors for hepatitis C. Sexually transmitted infections (STIs) Get screened for STIs, including gonorrhea and chlamydia, if: You are sexually active and are younger than 60 years of age. You are older than 60 years of age and your health care provider tells you that you are at risk for this type of infection. Your sexual activity has changed since you were last screened, and you are at increased risk for chlamydia or gonorrhea. Ask your health care provider if you are at risk. Ask your health care provider about whether you are at high risk for HIV. Your health care provider may recommend a prescription medicine to help prevent HIV infection. If you choose to take medicine to prevent HIV, you should first get tested for HIV. You should then be tested every 3 months for as long as you are taking the medicine. Pregnancy If you are about to stop having your period (premenopausal) and you may become pregnant, seek counseling before you get pregnant. Take 400 to 800  micrograms (mcg) of folic acid every day if you become pregnant. Ask for birth control (contraception) if you want to prevent pregnancy. Osteoporosis and menopause Osteoporosis is a disease in which the bones lose minerals and strength with aging. This can result in bone fractures. If you are 65 years old or older, or if you are at risk for osteoporosis and fractures, ask your health care provider if you should: Be screened for bone loss. Take a calcium or vitamin D supplement to lower your risk of fractures. Be given hormone replacement therapy (HRT) to treat symptoms of menopause. Follow these instructions at home: Lifestyle Do not use any products that contain nicotine or tobacco, such as cigarettes, e-cigarettes, and chewing tobacco. If you need help quitting, ask your health care provider. Do not use street drugs. Do not share needles. Ask your health care provider for help if you need support or information about quitting drugs. Alcohol use Do not drink  alcohol if: Your health care provider tells you not to drink. You are pregnant, may be pregnant, or are planning to become pregnant. If you drink alcohol: Limit how much you use to 0-1 drink a day. Limit intake if you are breastfeeding. Be aware of how much alcohol is in your drink. In the [place] one drink equals one 12 oz bottle of beer (355 mL), one 5 oz glass of wine (148 mL), or one 1 oz glass of hard liquor (44 mL). General instructions Schedule regular health, dental, and eye exams. Stay current with your vaccines. Tell your health care provider if: You often feel depressed. You have ever been abused or do not feel safe at home. Summary Adopting a healthy lifestyle and getting preventive care are important in promoting health and wellness. Follow your health care provider's instructions about healthy diet, exercising, and getting tested or screened for diseases. Follow your health care provider's instructions on monitoring  your cholesterol and blood pressure. This information is not intended to replace advice given to you by your health care provider. Make sure you discuss any questions you have with your health care provider. Document Revised: 11/20/2020 Document Reviewed: 09/05/2018 Elsevier Patient Education  2022 [employer].

## 2021-07-16 LAB — CBC WITH DIFFERENTIAL/PLATELET
Basophils Absolute: 0 10*3/uL (ref 0.0–0.2)
Basos: 1 %
EOS (ABSOLUTE): 0.2 10*3/uL (ref 0.0–0.4)
Eos: 3 %
Hematocrit: 42.8 % (ref 34.0–46.6)
Hemoglobin: 14.3 g/dL (ref 11.1–15.9)
Immature Grans (Abs): 0 10*3/uL (ref 0.0–0.1)
Immature Granulocytes: 0 %
Lymphocytes Absolute: 1.2 10*3/uL (ref 0.7–3.1)
Lymphs: 21 %
MCH: 29.3 pg (ref 26.6–33.0)
MCHC: 33.4 g/dL (ref 31.5–35.7)
MCV: 88 fL (ref 79–97)
Monocytes Absolute: 0.6 10*3/uL (ref 0.1–0.9)
Monocytes: 11 %
Neutrophils Absolute: 3.6 10*3/uL (ref 1.4–7.0)
Neutrophils: 64 %
Platelets: 194 10*3/uL (ref 150–450)
RBC: 4.88 x10E6/uL (ref 3.77–5.28)
RDW: 12.2 % (ref 11.7–15.4)
WBC: 5.6 10*3/uL (ref 3.4–10.8)

## 2021-07-16 LAB — COMPREHENSIVE METABOLIC PANEL
ALT: 25 IU/L (ref 0–32)
AST: 25 IU/L (ref 0–40)
Albumin/Globulin Ratio: 2.2 (ref 1.2–2.2)
Albumin: 4.6 g/dL (ref 3.8–4.9)
Alkaline Phosphatase: 107 IU/L (ref 44–121)
BUN/Creatinine Ratio: 15 (ref 9–23)
BUN: 12 mg/dL (ref 6–24)
Bilirubin Total: 0.5 mg/dL (ref 0.0–1.2)
CO2: 24 mmol/L (ref 20–29)
Calcium: 9.8 mg/dL (ref 8.7–10.2)
Chloride: 102 mmol/L (ref 96–106)
Creatinine, Ser: 0.78 mg/dL (ref 0.57–1.00)
Globulin, Total: 2.1 g/dL (ref 1.5–4.5)
Glucose: 92 mg/dL (ref 70–99)
Potassium: 4.5 mmol/L (ref 3.5–5.2)
Sodium: 140 mmol/L (ref 134–144)
Total Protein: 6.7 g/dL (ref 6.0–8.5)
eGFR: 87 mL/min/{1.73_m2} (ref >59)

## 2021-07-16 LAB — HEMOGLOBIN A1C
Est. average glucose Bld gHb Est-mCnc: 108 mg/dL
Hgb A1c MFr Bld: 5.4 % (ref 4.8–5.6)

## 2021-07-16 LAB — LIPID PANEL
Chol/HDL Ratio: 3.3 ratio (ref 0.0–4.4)
Cholesterol, Total: 196 mg/dL (ref 100–199)
HDL: 60 mg/dL (ref >39)
LDL Chol Calc (NIH): 124 mg/dL — ABNORMAL HIGH (ref 0–99)
Triglycerides: 67 mg/dL (ref 0–149)
VLDL Cholesterol Cal: 12 mg/dL (ref 5–40)

## 2021-07-16 LAB — CARDIOVASCULAR RISK ASSESSMENT

## 2021-07-16 LAB — TSH: TSH: 1.1 u[IU]/mL (ref 0.450–4.500)

## 2021-07-16 LAB — VITAMIN D 25 HYDROXY (VIT D DEFICIENCY, FRACTURES): Vit D, 25-Hydroxy: 63.3 ng/mL (ref 30.0–100.0)

## 2021-08-04 ENCOUNTER — Encounter: Payer: Self-pay | Admitting: Nurse Practitioner

## 2021-08-09 ENCOUNTER — Other Ambulatory Visit: Payer: Self-pay

## 2021-08-09 DIAGNOSIS — Z1231 Encounter for screening mammogram for malignant neoplasm of breast: Secondary | ICD-10-CM

## 2021-08-10 ENCOUNTER — Telehealth: Payer: Self-pay | Admitting: Nurse Practitioner

## 2021-08-10 NOTE — Telephone Encounter (Signed)
   Vanessa Morse has been scheduled for the following appointment:  WHAT: SCREENING MAMMOGRAM WHERE: [facility] DATE: 09/14/21 TIME: 9:45 ARRIVAL TIME  Patient has been made aware.

## 2021-09-29 ENCOUNTER — Other Ambulatory Visit: Payer: Self-pay

## 2021-09-29 DIAGNOSIS — Z1231 Encounter for screening mammogram for malignant neoplasm of breast: Secondary | ICD-10-CM

## 2022-07-11 NOTE — Progress Notes (Unsigned)
Subjective:  Patient ID: Vanessa Morse, female    DOB: 26-May-1961  Age: 61 y.o. MRN: [id number]  CC: Annual exam  HPI Encounter for general adult medical examination without abnormal findings  Physical ("At Risk" items are starred): Patient's last physical exam was 1 year ago .      SDOH Screenings   Food Insecurity: No Food Insecurity (07/12/2022)  Housing: Low Risk  (07/12/2022)  Transportation Needs: No Transportation Needs (07/12/2022)  Utilities: Not At Risk (07/12/2022)  Alcohol Screen: Low Risk  (07/12/2022)  Depression (PHQ2-9): Low Risk  (07/12/2022)  Financial Resource Strain: Low Risk  (07/12/2022)  Physical Activity: Sufficiently Active (07/12/2022)  Social Connections: Moderately Isolated (07/12/2022)  Stress: No Stress Concern Present (07/12/2022)  Tobacco Use: Low Risk  (07/15/2021)       07/12/2022    9:09 AM 07/14/2020    7:58 AM  Fall Risk   Falls in the past year? 0 0  Number falls in past yr: 0 0  Injury with Fall? 0 0  Risk for fall due to : No Fall Risks No Fall Risks  Follow up Falls evaluation completed        07/12/2022    9:09 AM 07/14/2020    7:58 AM  Depression screen PHQ 2/9  Decreased Interest 0 0  Down, Depressed, Hopeless 0 0  PHQ - 2 Score 0 0  Altered sleeping 0   Tired, decreased energy 0   Change in appetite 0   Feeling bad or failure about yourself  0   Trouble concentrating 0   Moving slowly or fidgety/restless 0   Suicidal thoughts 0   PHQ-9 Score 0   Difficult doing work/chores Not difficult at all     Functional Status Survey: Is the patient deaf or have difficulty hearing?: No Does the patient have difficulty seeing, even when wearing glasses/contacts?: No Does the patient have difficulty concentrating, remembering, or making decisions?: No Does the patient have difficulty walking or climbing stairs?: No Does the patient have difficulty dressing or bathing?: No Does the patient have difficulty doing errands alone  such as visiting a doctor's office or shopping?: No   Safety: reviewed ;  Patient wears a seat belt. Patient's home has smoke detectors and carbon monoxide detectors. Patient practices appropriate gun safety Patient wears sunscreen with extended sun exposure. Dental Care: biannual cleanings, brushes and flosses daily. Ophthalmology/Optometry: Annual visit.  Hearing loss: none Vision impairments: none Patient is not afflicted from Stress Incontinence and Urge Incontinence   Current Outpatient Medications on File Prior to Visit  Medication Sig Dispense Refill   acetaminophen (TYLENOL) 500 MG tablet Take 500 mg by mouth every 6 (six) hours as needed.     Apple Cider Vinegar 500 MG TABS Take 500 mg by mouth daily.     Ascorbic Acid (VITAMIN C) 1000 MG tablet Take 1,000 mg by mouth daily.     Biotin 2500 MCG CAPS Take 2,500 mcg by mouth daily.     diphenhydramine-acetaminophen (TYLENOL PM) 25-500 MG TABS tablet Take 1 tablet by mouth at bedtime as needed.     fluticasone (FLONASE) 50 MCG/ACT nasal spray Place 2 sprays into both nostrils daily. 16 g 6   loratadine (CLARITIN) 10 MG tablet TAKE ONE TABLET BY MOUTH EVERY DAY 90 tablet 0   Multiple Vitamins-Minerals (MULTIVITAMIN WITH MINERALS) tablet Take 1 tablet by mouth daily.     No current facility-administered medications on file prior to visit.    Social Hx  Social History   Socioeconomic History   Marital status: Divorced    Spouse name: Not on file   Number of children: Not on file   Years of education: Not on file   Highest education level: Not on file  Occupational History   Not on file  Tobacco Use   Smoking status: Never   Smokeless tobacco: Never  Vaping Use   Vaping Use: Never used  Substance and Sexual Activity   Alcohol use: Yes    Comment: Drinks on a social basis only.   Drug use: Never   Sexual activity: Not on file  Other Topics Concern   Not on file  Social History Narrative   Not on file   Social  Determinants of Health   Financial Resource Strain: Low Risk  (07/12/2022)   Overall Financial Resource Strain (CARDIA)    Difficulty of Paying Living Expenses: Not hard at all  Food Insecurity: No Food Insecurity (07/12/2022)   Hunger Vital Sign    Worried About Running Out of Food in the Last Year: Never true    Ran Out of Food in the Last Year: Never true  Transportation Needs: No Transportation Needs (07/12/2022)   PRAPARE - Administrator, Civil Service (Medical): No    Lack of Transportation (Non-Medical): No  Physical Activity: Sufficiently Active (07/12/2022)   Exercise Vital Sign    Days of Exercise per Week: 5 days    Minutes of Exercise per Session: 60 min  Stress: No Stress Concern Present (07/12/2022)   [employer] of Occupational Health - Occupational Stress Questionnaire    Feeling of Stress : Not at all  Social Connections: Moderately Isolated (07/12/2022)   Social Connection and Isolation Panel [NHANES]    Frequency of Communication with Friends and Family: More than three times a week    Frequency of Social Gatherings with Friends and Family: More than three times a week    Attends Religious Services: More than 4 times per year    Active Member of [employer] or Organizations: No    Attends Engineer, structural: Never    Marital Status: Divorced   Past Medical History:  Diagnosis Date   Abnormal mammogram of right breast    Acquired absence of both cervix and uterus    Mixed hyperlipidemia    Family History  Problem Relation Age of Onset   Arrhythmia Mother    Rheum arthritis Mother    Hypertension Father    Alzheimer's disease Father    Hypertension Sister    Hypertension Brother    Heart attack Paternal Grandfather     Review of Systems  Constitutional:  Negative for chills, fatigue and fever.  HENT:  Negative for congestion, ear pain, rhinorrhea and sore throat.   Respiratory:  Negative for cough and shortness of breath.    Cardiovascular:  Negative for chest pain.  Gastrointestinal:  Negative for abdominal pain, constipation, diarrhea, nausea and vomiting.  Genitourinary:  Negative for dysuria and urgency.  Musculoskeletal:  Positive for arthralgias (bilateral knees, chronic). Negative for back pain and myalgias.  Neurological:  Negative for dizziness, weakness, light-headedness and headaches.  Psychiatric/Behavioral:  Negative for dysphoric mood. The patient is not nervous/anxious.      Objective:  BP 128/76   Pulse 80   Temp (!) 96.3 F (35.7 C)   Ht 5\' 4"  (1.626 m)   Wt 186 lb (84.4 kg)   SpO2 100%   BMI 31.93 kg/m  07/12/2022    9:06 AM 07/15/2021    8:58 AM 09/15/2020   10:01 AM  BP/Weight  Systolic BP  134   Diastolic BP  80   Wt. (Lbs) 186 179.6 175  BMI 31.93 kg/m2 30.83 kg/m2 31 kg/m2    Physical Exam Vitals reviewed.  Constitutional:      Appearance: Normal appearance.  HENT:     Head: Normocephalic.     Right Ear: Tympanic membrane normal.     Left Ear: Tympanic membrane normal.     Nose: Nose normal.     Mouth/Throat:     Mouth: Mucous membranes are moist.  Eyes:     Pupils: Pupils are equal, round, and reactive to light.  Cardiovascular:     Rate and Rhythm: Normal rate and regular rhythm.  Pulmonary:     Effort: Pulmonary effort is normal.     Breath sounds: Normal breath sounds.  Abdominal:     General: Bowel sounds are normal.     Palpations: Abdomen is soft.  Musculoskeletal:        General: Tenderness (bilateral knees) present.     Cervical back: Neck supple.  Skin:    General: Skin is warm and dry.     Capillary Refill: Capillary refill takes less than 2 seconds.  Neurological:     General: No focal deficit present.     Mental Status: She is alert and oriented to person, place, and time.  Psychiatric:        Mood and Affect: Mood normal.        Behavior: Behavior normal.     Lab Results  Component Value Date   WBC 5.6 07/15/2021   HGB 14.3  07/15/2021   HCT 42.8 07/15/2021   PLT 194 07/15/2021   GLUCOSE 92 07/15/2021   CHOL 196 07/15/2021   TRIG 67 07/15/2021   HDL 60 07/15/2021   LDLCALC 124 (H) 07/15/2021   ALT 25 07/15/2021   AST 25 07/15/2021   NA 140 07/15/2021   K 4.5 07/15/2021   CL 102 07/15/2021   CREATININE 0.78 07/15/2021   BUN 12 07/15/2021   CO2 24 07/15/2021   TSH 1.100 07/15/2021   HGBA1C 5.4 07/15/2021      Assessment & Plan:   1. Annual physical exam - Comprehensive metabolic panel - CBC with Differential/Platelet - T4, free - TSH - VITAMIN D 25 Hydroxy (Vit-D Deficiency, Fractures)  2. Screening mammogram for breast cancer - MM DIGITAL SCREENING BILATERAL; Future  3. Screening for lipoid disorders - Lipid panel  4. Screening for diabetes mellitus - Hemoglobin A1c     We will call you with lab results Follow-up in 1 year for physical exam   These are the goals we discussed:  Goals   Improve chronic knee pain      This is a list of the screening recommended for you and due dates:  Health Maintenance  Topic Date Due   Zoster (Shingles) Vaccine (1 of 2) 10/12/2022*   Tetanus Vaccine  07/13/2023*   Mammogram  09/29/2022   Colon Cancer Screening  10/17/2024   HPV Vaccine  Aged Out   Flu Shot  Discontinued   Pap Smear  Discontinued   COVID-19 Vaccine  Discontinued  *Topic was postponed. The date shown is not the original due date.      AN INDIVIDUALIZED CARE PLAN: was established or reinforced today.   SELF MANAGEMENT: The patient and I together assessed ways to personally work towards  obtaining the recommended goals  Support needs The patient and/or family needs were assessed and services were offered and not necessary at this time.    Follow-up: 1 year  Signed, Flonnie Hailstone, DNP [facility] [phone]

## 2022-07-12 ENCOUNTER — Ambulatory Visit (INDEPENDENT_AMBULATORY_CARE_PROVIDER_SITE_OTHER): Payer: BC Managed Care – PPO | Admitting: Nurse Practitioner

## 2022-07-12 ENCOUNTER — Encounter: Payer: Self-pay | Admitting: Nurse Practitioner

## 2022-07-12 VITALS — BP 128/76 | HR 80 | Temp 96.3°F | Ht 64.0 in | Wt 186.0 lb

## 2022-07-12 DIAGNOSIS — Z131 Encounter for screening for diabetes mellitus: Secondary | ICD-10-CM

## 2022-07-12 DIAGNOSIS — Z0000 Encounter for general adult medical examination without abnormal findings: Secondary | ICD-10-CM

## 2022-07-12 DIAGNOSIS — Z13220 Encounter for screening for lipoid disorders: Secondary | ICD-10-CM | POA: Diagnosis not present

## 2022-07-12 DIAGNOSIS — Z1231 Encounter for screening mammogram for malignant neoplasm of breast: Secondary | ICD-10-CM | POA: Diagnosis not present

## 2022-07-12 NOTE — Patient Instructions (Signed)
We will call you with lab results Follow-up in 1 year for physical exam   Health Maintenance, Female Adopting a healthy lifestyle and getting preventive care are important in promoting health and wellness. Ask your health care provider about: The right schedule for you to have regular tests and exams. Things you can do on your own to prevent diseases and keep yourself healthy. What should I know about diet, weight, and exercise? Eat a healthy diet  Eat a diet that includes plenty of vegetables, fruits, low-fat dairy products, and lean protein. Do not eat a lot of foods that are high in solid fats, added sugars, or sodium. Maintain a healthy weight Body mass index (BMI) is used to identify weight problems. It estimates body fat based on height and weight. Your health care provider can help determine your BMI and help you achieve or maintain a healthy weight. Get regular exercise Get regular exercise. This is one of the most important things you can do for your health. Most adults should: Exercise for at least 150 minutes each week. The exercise should increase your heart rate and make you sweat (moderate-intensity exercise). Do strengthening exercises at least twice a week. This is in addition to the moderate-intensity exercise. Spend less time sitting. Even light physical activity can be beneficial. Watch cholesterol and blood lipids Have your blood tested for lipids and cholesterol at 61 years of age, then have this test every 5 years. Have your cholesterol levels checked more often if: Your lipid or cholesterol levels are high. You are older than 61 years of age. You are at high risk for heart disease. What should I know about cancer screening? Depending on your health history and family history, you may need to have cancer screening at various ages. This may include screening for: Breast cancer. Cervical cancer. Colorectal cancer. Skin cancer. Lung cancer. What should I know about  heart disease, diabetes, and high blood pressure? Blood pressure and heart disease High blood pressure causes heart disease and increases the risk of stroke. This is more likely to develop in people who have high blood pressure readings or are overweight. Have your blood pressure checked: Every 3-5 years if you are 63-70 years of age. Every year if you are 90 years old or older. Diabetes Have regular diabetes screenings. This checks your fasting blood sugar level. Have the screening done: Once every three years after age 2 if you are at a normal weight and have a low risk for diabetes. More often and at a younger age if you are overweight or have a high risk for diabetes. What should I know about preventing infection? Hepatitis B If you have a higher risk for hepatitis B, you should be screened for this virus. Talk with your health care provider to find out if you are at risk for hepatitis B infection. Hepatitis C Testing is recommended for: Everyone born from [birth year] through 1965. Anyone with known risk factors for hepatitis C. Sexually transmitted infections (STIs) Get screened for STIs, including gonorrhea and chlamydia, if: You are sexually active and are younger than 61 years of age. You are older than 61 years of age and your health care provider tells you that you are at risk for this type of infection. Your sexual activity has changed since you were last screened, and you are at increased risk for chlamydia or gonorrhea. Ask your health care provider if you are at risk. Ask your health care provider about whether you are at high  risk for HIV. Your health care provider may recommend a prescription medicine to help prevent HIV infection. If you choose to take medicine to prevent HIV, you should first get tested for HIV. You should then be tested every 3 months for as long as you are taking the medicine. Pregnancy If you are about to stop having your period (premenopausal) and you may  become pregnant, seek counseling before you get pregnant. Take 400 to 800 micrograms (mcg) of folic acid every day if you become pregnant. Ask for birth control (contraception) if you want to prevent pregnancy. Osteoporosis and menopause Osteoporosis is a disease in which the bones lose minerals and strength with aging. This can result in bone fractures. If you are 26 years old or older, or if you are at risk for osteoporosis and fractures, ask your health care provider if you should: Be screened for bone loss. Take a calcium or vitamin D supplement to lower your risk of fractures. Be given hormone replacement therapy (HRT) to treat symptoms of menopause. Follow these instructions at home: Alcohol use Do not drink alcohol if: Your health care provider tells you not to drink. You are pregnant, may be pregnant, or are planning to become pregnant. If you drink alcohol: Limit how much you have to: 0-1 drink a day. Know how much alcohol is in your drink. In the [place] one drink equals one 12 oz bottle of beer (355 mL), one 5 oz glass of wine (148 mL), or one 1 oz glass of hard liquor (44 mL). Lifestyle Do not use any products that contain nicotine or tobacco. These products include cigarettes, chewing tobacco, and vaping devices, such as e-cigarettes. If you need help quitting, ask your health care provider. Do not use street drugs. Do not share needles. Ask your health care provider for help if you need support or information about quitting drugs. General instructions Schedule regular health, dental, and eye exams. Stay current with your vaccines. Tell your health care provider if: You often feel depressed. You have ever been abused or do not feel safe at home. Summary Adopting a healthy lifestyle and getting preventive care are important in promoting health and wellness. Follow your health care provider's instructions about healthy diet, exercising, and getting tested or screened for  diseases. Follow your health care provider's instructions on monitoring your cholesterol and blood pressure. This information is not intended to replace advice given to you by your health care provider. Make sure you discuss any questions you have with your health care provider. Document Revised: 02/01/2021 Document Reviewed: 02/01/2021 Elsevier Patient Education  [place].

## 2022-07-13 LAB — CBC WITH DIFFERENTIAL/PLATELET
Basophils Absolute: 0 10*3/uL (ref 0.0–0.2)
Basos: 1 %
EOS (ABSOLUTE): 0.2 10*3/uL (ref 0.0–0.4)
Eos: 4 %
Hematocrit: 40.3 % (ref 34.0–46.6)
Hemoglobin: 14 g/dL (ref 11.1–15.9)
Immature Grans (Abs): 0 10*3/uL (ref 0.0–0.1)
Immature Granulocytes: 0 %
Lymphocytes Absolute: 1.5 10*3/uL (ref 0.7–3.1)
Lymphs: 29 %
MCH: 29.3 pg (ref 26.6–33.0)
MCHC: 34.7 g/dL (ref 31.5–35.7)
MCV: 84 fL (ref 79–97)
Monocytes Absolute: 0.5 10*3/uL (ref 0.1–0.9)
Monocytes: 10 %
Neutrophils Absolute: 2.9 10*3/uL (ref 1.4–7.0)
Neutrophils: 56 %
Platelets: 196 10*3/uL (ref 150–450)
RBC: 4.78 x10E6/uL (ref 3.77–5.28)
RDW: 12.2 % (ref 11.7–15.4)
WBC: 5.3 10*3/uL (ref 3.4–10.8)

## 2022-07-13 LAB — LIPID PANEL
Chol/HDL Ratio: 3.9 ratio (ref 0.0–4.4)
Cholesterol, Total: 221 mg/dL — ABNORMAL HIGH (ref 100–199)
HDL: 57 mg/dL (ref >39)
LDL Chol Calc (NIH): 143 mg/dL — ABNORMAL HIGH (ref 0–99)
Triglycerides: 120 mg/dL (ref 0–149)
VLDL Cholesterol Cal: 21 mg/dL (ref 5–40)

## 2022-07-13 LAB — COMPREHENSIVE METABOLIC PANEL
ALT: 24 IU/L (ref 0–32)
AST: 25 IU/L (ref 0–40)
Albumin/Globulin Ratio: 1.9 (ref 1.2–2.2)
Albumin: 4.6 g/dL (ref 3.8–4.9)
Alkaline Phosphatase: 102 IU/L (ref 44–121)
BUN/Creatinine Ratio: 12 (ref 12–28)
BUN: 9 mg/dL (ref 8–27)
Bilirubin Total: 0.5 mg/dL (ref 0.0–1.2)
CO2: 24 mmol/L (ref 20–29)
Calcium: 9.5 mg/dL (ref 8.7–10.3)
Chloride: 103 mmol/L (ref 96–106)
Creatinine, Ser: 0.78 mg/dL (ref 0.57–1.00)
Globulin, Total: 2.4 g/dL (ref 1.5–4.5)
Glucose: 95 mg/dL (ref 70–99)
Potassium: 4.4 mmol/L (ref 3.5–5.2)
Sodium: 141 mmol/L (ref 134–144)
Total Protein: 7 g/dL (ref 6.0–8.5)
eGFR: 87 mL/min/{1.73_m2} (ref >59)

## 2022-07-13 LAB — CARDIOVASCULAR RISK ASSESSMENT

## 2022-07-13 LAB — TSH: TSH: 1.19 u[IU]/mL (ref 0.450–4.500)

## 2022-07-13 LAB — HEMOGLOBIN A1C
Est. average glucose Bld gHb Est-mCnc: 103 mg/dL
Hgb A1c MFr Bld: 5.2 % (ref 4.8–5.6)

## 2022-07-13 LAB — T4, FREE: Free T4: 1.37 ng/dL (ref 0.82–1.77)

## 2022-07-13 LAB — VITAMIN D 25 HYDROXY (VIT D DEFICIENCY, FRACTURES): Vit D, 25-Hydroxy: 44.3 ng/mL (ref 30.0–100.0)

## 2022-07-21 ENCOUNTER — Encounter: Payer: Self-pay | Admitting: Nurse Practitioner

## 2022-09-16 DIAGNOSIS — Z1231 Encounter for screening mammogram for malignant neoplasm of breast: Secondary | ICD-10-CM | POA: Diagnosis not present

## 2022-09-16 LAB — MM DIGITAL SCREENING BILATERAL

## 2022-09-21 ENCOUNTER — Other Ambulatory Visit: Payer: Self-pay

## 2022-09-21 DIAGNOSIS — Z1231 Encounter for screening mammogram for malignant neoplasm of breast: Secondary | ICD-10-CM

## 2023-05-03 DIAGNOSIS — Z7689 Persons encountering health services in other specified circumstances: Secondary | ICD-10-CM | POA: Diagnosis not present

## 2023-05-03 DIAGNOSIS — Z13220 Encounter for screening for lipoid disorders: Secondary | ICD-10-CM | POA: Diagnosis not present

## 2023-08-17 ENCOUNTER — Encounter: Payer: Self-pay | Admitting: Physician Assistant

## 2023-08-17 ENCOUNTER — Ambulatory Visit (INDEPENDENT_AMBULATORY_CARE_PROVIDER_SITE_OTHER): Payer: BC Managed Care – PPO | Admitting: Physician Assistant

## 2023-08-17 VITALS — BP 132/82 | HR 79 | Temp 97.4°F | Ht 64.0 in | Wt 176.0 lb

## 2023-08-17 DIAGNOSIS — Z1231 Encounter for screening mammogram for malignant neoplasm of breast: Secondary | ICD-10-CM | POA: Diagnosis not present

## 2023-08-17 DIAGNOSIS — N958 Other specified menopausal and perimenopausal disorders: Secondary | ICD-10-CM | POA: Diagnosis not present

## 2023-08-17 DIAGNOSIS — Z0000 Encounter for general adult medical examination without abnormal findings: Secondary | ICD-10-CM | POA: Diagnosis not present

## 2023-08-17 NOTE — Progress Notes (Signed)
Subjective:  Patient ID: Vanessa Morse, female    DOB: 06-18-1961  Age: 62 y.o. MRN: [id number]  Chief Complaint  Patient presents with   Annual Exam    HPI Well Adult Physical: Patient here for a comprehensive physical exam.The patient reports no problems Do you take any herbs or supplements that were not prescribed by a doctor? no Are you taking calcium supplements? no Are you taking aspirin daily? no Pt is following with weight loss clinic and currently on phentermine and topamax Encounter for general adult medical examination without abnormal findings  Physical ("At Risk" items are starred): Patient's last physical exam was 1 year ago .  Patient is not afflicted from Stress Incontinence and Urge Incontinence  Patient wears a seat belts Patient has smoke detectors and has carbon monoxide detectors. Patient practices appropriate gun safety. Patient wears sunscreen with extended sun exposure. Dental Care: brushes and flosses daily. Last dental visit: up to date Vision impairments: none Ophthalmology/Optometry: is overdue Hearing loss: none  No LMP recorded. Patient has had a hysterectomy.  Safe at home: Yes Self breast exams: Yes Last pap: had hysterectomy Last mammogram: is due     08/17/2023    8:41 AM 07/12/2022    9:09 AM 07/14/2020    7:58 AM  Depression screen PHQ 2/9  Decreased Interest 0 0 0  Down, Depressed, Hopeless 0 0 0  PHQ - 2 Score 0 0 0  Altered sleeping 0 0   Tired, decreased energy 0 0   Change in appetite 0 0   Feeling bad or failure about yourself  0 0   Trouble concentrating 0 0   Moving slowly or fidgety/restless 0 0   Suicidal thoughts 0 0   PHQ-9 Score 0 0   Difficult doing work/chores Not difficult at all Not difficult at all          07/14/2020    7:58 AM 07/12/2022    9:09 AM 08/17/2023    8:39 AM  Fall Risk  Falls in the past year? 0 0 0  Was there an injury with Fall? 0 0 0  Fall Risk Category Calculator 0 0 0  Fall Risk  Category (Retired) Low Low   (RETIRED) Patient Fall Risk Level Low fall risk Low fall risk   Patient at Risk for Falls Due to No Fall Risks No Fall Risks No Fall Risks  Fall risk Follow up  Falls evaluation completed Falls evaluation completed             Social Hx   Social History   Socioeconomic History   Marital status: Divorced    Spouse name: Not on file   Number of children: Not on file   Years of education: Not on file   Highest education level: Not on file  Occupational History   Not on file  Tobacco Use   Smoking status: Never   Smokeless tobacco: Never  Vaping Use   Vaping status: Never Used  Substance and Sexual Activity   Alcohol use: Yes    Comment: Drinks on a social basis only.   Drug use: Never   Sexual activity: Not on file  Other Topics Concern   Not on file  Social History Narrative   Not on file   Social Determinants of Health   Financial Resource Strain: Low Risk  (08/17/2023)   Overall Financial Resource Strain (CARDIA)    Difficulty of Paying Living Expenses: Not hard at all  Food Insecurity: No  Food Insecurity (08/17/2023)   Hunger Vital Sign    Worried About Running Out of Food in the Last Year: Never true    Ran Out of Food in the Last Year: Never true  Transportation Needs: No Transportation Needs (07/07/2023)   Received from [employer]    In the past 12 months, has lack of reliable transportation kept you from medical appointments, meetings, work or from getting things needed for daily living? : No  Physical Activity: Sufficiently Active (08/17/2023)   Exercise Vital Sign    Days of Exercise per Week: 5 days    Minutes of Exercise per Session: 60 min  Stress: No Stress Concern Present (08/17/2023)   [employer] of Occupational Health - Occupational Stress Questionnaire    Feeling of Stress : Not at all  Social Connections: Moderately Isolated (08/17/2023)   Social Connection and Isolation Panel [NHANES]     Frequency of Communication with Friends and Family: More than three times a week    Frequency of Social Gatherings with Friends and Family: More than three times a week    Attends Religious Services: More than 4 times per year    Active Member of [employer] or Organizations: Not on file    Attends Banker Meetings: Never    Marital Status: Divorced   Past Medical History:  Diagnosis Date   Abnormal mammogram of right breast    Acquired absence of both cervix and uterus    Mixed hyperlipidemia    Past Surgical History:  Procedure Laterality Date   ABDOMINAL HYSTERECTOMY     RADIOACTIVE SEED GUIDED EXCISIONAL BREAST BIOPSY Right 11/27/2018   Procedure: RIGHT RADIOACTIVE SEED GUIDED EXCISIONAL BREAST BIOPSY ERAS PATHWAY;  Surgeon: Almond Lint, MD;  Location: [facility] OR;  Service: General;  Laterality: Right;   TONSILLECTOMY      Family History  Problem Relation Age of Onset   Arrhythmia Mother    Rheum arthritis Mother    Hypertension Father    Alzheimer's disease Father    Hypertension Sister    Hypertension Brother    Heart attack Paternal Grandfather     ROS CONSTITUTIONAL: Negative for chills, fatigue, fever, unintentional weight gain and unintentional weight loss.  E/N/T: Negative for ear pain, nasal congestion and sore throat.  CARDIOVASCULAR: Negative for chest pain, dizziness, palpitations and pedal edema.  RESPIRATORY: Negative for recent cough and dyspnea.  GASTROINTESTINAL: Negative for abdominal pain, acid reflux symptoms, constipation, diarrhea, nausea and vomiting.  MSK: Negative for arthralgias and myalgias.  INTEGUMENTARY: Negative for rash.  NEUROLOGICAL: Negative for dizziness and headaches.  PSYCHIATRIC: Negative for sleep disturbance and to question depression screen.  Negative for depression, negative for anhedonia.   Objective:  PHYSICAL EXAM:   BP 132/82 (BP Location: Left Arm, Patient Position: Sitting)   Pulse 79   Temp (!) 97.4 F (36.3 C)  (Temporal)   Ht 5\' 4"  (1.626 m)   Wt 176 lb (79.8 kg)   SpO2 98%   BMI 30.21 kg/m   Vision Screening   Right eye Left eye Both eyes  Without correction     With correction 20/25 20/40 20/25     GEN: Well nourished, well developed, in no acute distress  HEENT: normal external ears and nose - normal external auditory canals and TMS - hearing grossly normal -  - Lips, Teeth and Gums - normal  Oropharynx - normal mucosa, palate, and posterior pharynx Cardiac: RRR; no murmurs, rubs, or gallops,no edema - no  significant varicosities Respiratory:  normal respiratory rate and pattern with no distress - normal breath sounds with no rales, rhonchi, wheezes or rubs GI: normal bowel sounds, no masses or tenderness MS: no deformity or atrophy  Skin: warm and dry, no rash  Neuro:  Alert and Oriented x 3, Strength and sensation are intact - CN II-Xii grossly intact Psych: euthymic mood, appropriate affect and demeanor  Assessment & Plan:  Annual physical exam -     CBC with Differential/Platelet -     Comprehensive metabolic panel -     TSH -     Lipid panel  Encounter for screening mammogram for breast cancer -     3D Screening Mammogram, Left and Right; Future  Other specified menopausal and perimenopausal disorders -     DG Bone Density; Future    This is a list of the screening recommended for you and due dates:  Health Maintenance  Topic Date Due   DTaP/Tdap/Td vaccine (1 - Tdap) Never done   Zoster (Shingles) Vaccine (1 of 2) Never done   Flu Shot  12/25/2023*   Mammogram  09/17/2023   Colon Cancer Screening  10/17/2024   HPV Vaccine  Aged Out   COVID-19 Vaccine  Discontinued  *Topic was postponed. The date shown is not the original due date.     Follow-up: Return in about 1 year (around 08/16/2024) for fasting physical - 20 min.  An After Visit Summary was printed and given to the patient.  Jettie Pagan [facility] [phone]

## 2023-08-18 ENCOUNTER — Other Ambulatory Visit: Payer: Self-pay | Admitting: Physician Assistant

## 2023-08-18 DIAGNOSIS — E782 Mixed hyperlipidemia: Secondary | ICD-10-CM

## 2023-08-18 LAB — CBC WITH DIFFERENTIAL/PLATELET
Basophils Absolute: 0.1 10*3/uL (ref 0.0–0.2)
Basos: 1 %
EOS (ABSOLUTE): 0.2 10*3/uL (ref 0.0–0.4)
Eos: 3 %
Hematocrit: 42 % (ref 34.0–46.6)
Hemoglobin: 13.9 g/dL (ref 11.1–15.9)
Immature Grans (Abs): 0 10*3/uL (ref 0.0–0.1)
Immature Granulocytes: 0 %
Lymphocytes Absolute: 1.5 10*3/uL (ref 0.7–3.1)
Lymphs: 29 %
MCH: 29.4 pg (ref 26.6–33.0)
MCHC: 33.1 g/dL (ref 31.5–35.7)
MCV: 89 fL (ref 79–97)
Monocytes Absolute: 0.5 10*3/uL (ref 0.1–0.9)
Monocytes: 10 %
Neutrophils Absolute: 3 10*3/uL (ref 1.4–7.0)
Neutrophils: 57 %
Platelets: 230 10*3/uL (ref 150–450)
RBC: 4.73 x10E6/uL (ref 3.77–5.28)
RDW: 12.5 % (ref 11.7–15.4)
WBC: 5.2 10*3/uL (ref 3.4–10.8)

## 2023-08-18 LAB — LIPID PANEL
Chol/HDL Ratio: 4 ratio (ref 0.0–4.4)
Cholesterol, Total: 214 mg/dL — ABNORMAL HIGH (ref 100–199)
HDL: 54 mg/dL (ref >39)
LDL Chol Calc (NIH): 146 mg/dL — ABNORMAL HIGH (ref 0–99)
Triglycerides: 81 mg/dL (ref 0–149)
VLDL Cholesterol Cal: 14 mg/dL (ref 5–40)

## 2023-08-18 LAB — COMPREHENSIVE METABOLIC PANEL
ALT: 18 [IU]/L (ref 0–32)
AST: 24 [IU]/L (ref 0–40)
Albumin: 4.4 g/dL (ref 3.9–4.9)
Alkaline Phosphatase: 97 [IU]/L (ref 44–121)
BUN/Creatinine Ratio: 13 (ref 12–28)
BUN: 11 mg/dL (ref 8–27)
Bilirubin Total: 0.6 mg/dL (ref 0.0–1.2)
CO2: 23 mmol/L (ref 20–29)
Calcium: 9.6 mg/dL (ref 8.7–10.3)
Chloride: 103 mmol/L (ref 96–106)
Creatinine, Ser: 0.87 mg/dL (ref 0.57–1.00)
Globulin, Total: 2.5 g/dL (ref 1.5–4.5)
Glucose: 93 mg/dL (ref 70–99)
Potassium: 4.5 mmol/L (ref 3.5–5.2)
Sodium: 140 mmol/L (ref 134–144)
Total Protein: 6.9 g/dL (ref 6.0–8.5)
eGFR: 75 mL/min/{1.73_m2} (ref >59)

## 2023-08-18 LAB — TSH: TSH: 0.929 u[IU]/mL (ref 0.450–4.500)

## 2023-08-18 MED ORDER — ATORVASTATIN CALCIUM 10 MG PO TABS
10.0000 mg | ORAL_TABLET | Freq: Every day | ORAL | 1 refills | Status: DC
Start: 2023-08-18 — End: 2024-02-23

## 2023-08-23 ENCOUNTER — Telehealth: Payer: Self-pay | Admitting: Physician Assistant

## 2023-08-23 NOTE — Telephone Encounter (Signed)
   Vanessa Morse has been scheduled for the following appointment:  WHAT: MAMMOGRAM & BONE DENSITY WHERE: [facility] DATE: 10/27/2023 TIME: 9:00 AM CHECK-IN  A message has been left for the patient.

## 2023-10-27 DIAGNOSIS — Z1231 Encounter for screening mammogram for malignant neoplasm of breast: Secondary | ICD-10-CM | POA: Diagnosis not present

## 2023-10-27 DIAGNOSIS — N959 Unspecified menopausal and perimenopausal disorder: Secondary | ICD-10-CM | POA: Diagnosis not present

## 2023-10-27 LAB — HM DEXA SCAN

## 2023-10-27 LAB — HM MAMMOGRAPHY

## 2023-10-28 DIAGNOSIS — Z13820 Encounter for screening for osteoporosis: Secondary | ICD-10-CM | POA: Diagnosis not present

## 2023-10-28 DIAGNOSIS — N959 Unspecified menopausal and perimenopausal disorder: Secondary | ICD-10-CM | POA: Diagnosis not present

## 2023-10-28 DIAGNOSIS — M85852 Other specified disorders of bone density and structure, left thigh: Secondary | ICD-10-CM | POA: Diagnosis not present

## 2023-10-30 ENCOUNTER — Encounter: Payer: Self-pay | Admitting: Physician Assistant

## 2023-11-27 ENCOUNTER — Ambulatory Visit

## 2023-11-27 ENCOUNTER — Telehealth: Payer: Self-pay | Admitting: Physician Assistant

## 2023-11-27 DIAGNOSIS — E782 Mixed hyperlipidemia: Secondary | ICD-10-CM

## 2023-11-27 NOTE — Telephone Encounter (Signed)
 Patient came in today for her labs and she stated the she does not want to take Lipitor, states she has been told it's bad for your health. Can you please discuss with patient when you call with labs results.

## 2023-11-28 LAB — COMPREHENSIVE METABOLIC PANEL
ALT: 23 IU/L (ref 0–32)
AST: 20 IU/L (ref 0–40)
Albumin: 4.5 g/dL (ref 3.9–4.9)
Alkaline Phosphatase: 104 IU/L (ref 44–121)
BUN/Creatinine Ratio: 17 (ref 12–28)
BUN: 15 mg/dL (ref 8–27)
Bilirubin Total: 0.5 mg/dL (ref 0.0–1.2)
CO2: 20 mmol/L (ref 20–29)
Calcium: 9.8 mg/dL (ref 8.7–10.3)
Chloride: 104 mmol/L (ref 96–106)
Creatinine, Ser: 0.88 mg/dL (ref 0.57–1.00)
Globulin, Total: 2.1 g/dL (ref 1.5–4.5)
Glucose: 91 mg/dL (ref 70–99)
Potassium: 4.6 mmol/L (ref 3.5–5.2)
Sodium: 142 mmol/L (ref 134–144)
Total Protein: 6.6 g/dL (ref 6.0–8.5)
eGFR: 74 mL/min/{1.73_m2} (ref >59)

## 2023-11-28 LAB — LIPID PANEL
Chol/HDL Ratio: 2.6 ratio (ref 0.0–4.4)
Cholesterol, Total: 154 mg/dL (ref 100–199)
HDL: 59 mg/dL (ref >39)
LDL Chol Calc (NIH): 82 mg/dL (ref 0–99)
Triglycerides: 64 mg/dL (ref 0–149)
VLDL Cholesterol Cal: 13 mg/dL (ref 5–40)

## 2023-12-07 DIAGNOSIS — R55 Syncope and collapse: Secondary | ICD-10-CM | POA: Diagnosis not present

## 2024-01-05 DIAGNOSIS — R55 Syncope and collapse: Secondary | ICD-10-CM | POA: Diagnosis not present

## 2024-02-23 ENCOUNTER — Other Ambulatory Visit: Payer: Self-pay | Admitting: Family Medicine

## 2024-02-23 ENCOUNTER — Other Ambulatory Visit

## 2024-02-23 DIAGNOSIS — E782 Mixed hyperlipidemia: Secondary | ICD-10-CM

## 2024-02-23 MED ORDER — ATORVASTATIN CALCIUM 10 MG PO TABS: 10.0000 mg | ORAL_TABLET | Freq: Every day | ORAL | 1 refills | Status: DC

## 2024-08-21 ENCOUNTER — Ambulatory Visit (INDEPENDENT_AMBULATORY_CARE_PROVIDER_SITE_OTHER): Admitting: Physician Assistant

## 2024-08-21 ENCOUNTER — Encounter: Payer: Self-pay | Admitting: Physician Assistant

## 2024-08-21 VITALS — BP 138/88 | HR 83 | Temp 98.3°F | Resp 18 | Ht 64.5 in | Wt 180.6 lb

## 2024-08-21 DIAGNOSIS — R030 Elevated blood-pressure reading, without diagnosis of hypertension: Secondary | ICD-10-CM | POA: Diagnosis not present

## 2024-08-21 DIAGNOSIS — R42 Dizziness and giddiness: Secondary | ICD-10-CM | POA: Diagnosis not present

## 2024-08-21 DIAGNOSIS — E782 Mixed hyperlipidemia: Secondary | ICD-10-CM | POA: Diagnosis not present

## 2024-08-21 DIAGNOSIS — Z1231 Encounter for screening mammogram for malignant neoplasm of breast: Secondary | ICD-10-CM

## 2024-08-21 MED ORDER — ATORVASTATIN CALCIUM 10 MG PO TABS: 10.0000 mg | ORAL_TABLET | Freq: Every day | ORAL | 1 refills | Status: AC

## 2024-08-21 NOTE — Progress Notes (Signed)
 Subjective:  Patient ID: Vanessa Morse, female    DOB: 09-14-1961  Age: 63 y.o. MRN: [id number]  Chief Complaint  Patient presents with   Medical Management of Chronic Issues    HPI Mixed hyperlipidemia  Pt presents with hyperlipidemia. Compliance with treatment has been good -The patient is compliant with medications, maintains a low cholesterol diet , follows up as directed ,The patient denies experiencing any hypercholesterolemia related symptoms. She is currently on lipitor 10mg  qd  Pt with allergic rhinitis - symptoms stable with flonase  and claritin   Pt has elevated bp at todays visit.  She states she has had issues in the past and monitors bp at work and at home.  She states at home bp normal.  She has not had diagnosis of hypertension in the past.  She denies chest pain/sob/palpitations. She does state over the past few years she has had several episodes of lightheadedness and feeling like she was going to pass out.   States the symptoms only lasted several seconds/minute and would then resolve.  She has been evaluated in ED and at Urgent Care in the past but has never had cardiology workup She did see a neurologist earlier this year and was recommended to get EEG but pt declined Last episode was several months ago  Pt would like to schedule mammogram      08/21/2024    9:13 AM 08/17/2023    8:41 AM 07/12/2022    9:09 AM 07/14/2020    7:58 AM  Depression screen PHQ 2/9  Decreased Interest 0 0 0 0  Down, Depressed, Hopeless 0 0 0 0  PHQ - 2 Score 0 0 0 0  Altered sleeping  0 0   Tired, decreased energy  0 0   Change in appetite  0 0   Feeling bad or failure about yourself   0 0   Trouble concentrating  0 0   Moving slowly or fidgety/restless  0 0   Suicidal thoughts  0 0   PHQ-9 Score  0  0    Difficult doing work/chores  Not difficult at all Not difficult at all      Data saved with a previous flowsheet row definition        07/14/2020    7:58 AM 07/12/2022     9:09 AM 08/17/2023    8:39 AM 08/21/2024    9:13 AM  Fall Risk  Falls in the past year? 0 0 0 0  Was there an injury with Fall? 0 0 0 0  Fall Risk Category Calculator 0 0 0 0  Fall Risk Category (Retired) Low  Low     (RETIRED) Patient Fall Risk Level Low fall risk  Low fall risk     Patient at Risk for Falls Due to No Fall Risks No Fall Risks No Fall Risks No Fall Risks  Fall risk Follow up  Falls evaluation completed  Falls evaluation completed Falls evaluation completed     Data saved with a previous flowsheet row definition     ROS CONSTITUTIONAL: Negative for chills, fatigue, fever, unintentional weight gain and unintentional weight loss.  E/N/T: Negative for ear pain, nasal congestion and sore throat.  CARDIOVASCULAR:see HPI RESPIRATORY: Negative for recent cough and dyspnea.  GASTROINTESTINAL: Negative for abdominal pain, acid reflux symptoms, constipation, diarrhea, nausea and vomiting.  MSK: Negative for arthralgias and myalgias.  INTEGUMENTARY: Negative for rash.  NEUROLOGICAL: see HPI PSYCHIATRIC: Negative for sleep disturbance and to question depression screen.  Negative for depression, negative for anhedonia.    Current Outpatient Medications:    acetaminophen  (TYLENOL ) 500 MG tablet, Take 500 mg by mouth every 6 (six) hours as needed., Disp: , Rfl:    Apple Cider Vinegar 500 MG TABS, Take 500 mg by mouth daily., Disp: , Rfl:    Ascorbic Acid (VITAMIN C) 1000 MG tablet, Take 1,000 mg by mouth daily., Disp: , Rfl:    Biotin 2500 MCG CAPS, Take 2,500 mcg by mouth daily., Disp: , Rfl:    diphenhydramine-acetaminophen  (TYLENOL  PM) 25-500 MG TABS tablet, Take 1 tablet by mouth at bedtime as needed., Disp: , Rfl:    fluticasone  (FLONASE ) 50 MCG/ACT nasal spray, Place 2 sprays into both nostrils daily., Disp: 16 g, Rfl: 6   loratadine  (CLARITIN ) 10 MG tablet, TAKE ONE TABLET BY MOUTH EVERY DAY, Disp: 90 tablet, Rfl: 0   Multiple Vitamins-Minerals (MULTIVITAMIN WITH MINERALS)  tablet, Take 1 tablet by mouth daily., Disp: , Rfl:    atorvastatin  (LIPITOR) 10 MG tablet, Take 1 tablet (10 mg total) by mouth daily., Disp: 90 tablet, Rfl: 1  Past Medical History:  Diagnosis Date   Abnormal mammogram of right breast    Acquired absence of both cervix and uterus    Mixed hyperlipidemia    Objective:  PHYSICAL EXAM:   BP 138/88   Pulse 83   Temp 98.3 F (36.8 C) (Temporal)   Resp 18   Ht 5' 4.5 (1.638 m)   Wt 180 lb 9.6 oz (81.9 kg)   SpO2 98%   BMI 30.52 kg/m    GEN: Well nourished, well developed, in no acute distress  Cardiac: RRR; no murmurs, rubs, or gallops,no edema -  Respiratory:  normal respiratory rate and pattern with no distress - normal breath sounds with no rales, rhonchi, wheezes or rubs GI: normal bowel sounds, no masses or tenderness  Skin: warm and dry, no rash   Psych: euthymic mood, appropriate affect and demeanor  EKG normal Assessment & Plan:    Mixed hyperlipidemia -     CBC with Differential/Platelet -     Comprehensive metabolic panel with GFR -     Lipid panel -     Atorvastatin  Calcium ; Take 1 tablet (10 mg total) by mouth daily.  Dispense: 90 tablet; Refill: 1  Dizziness, nonspecific -     CBC with Differential/Platelet -     Comprehensive metabolic panel with GFR -     Lipid panel -     TSH -     EKG 12-Lead Recommend cardiology referral - pt will consider after lab results Elevated blood pressure reading in office without diagnosis of hypertension -     EKG 12-Lead DASH diet Encounter for screening mammogram for breast cancer -     3D Screening Mammogram, Left and Right; Future     Follow-up: Return in about 6 months (around 02/18/2025) for fasting physical 40 min.  An After Visit Summary was printed and given to the patient.  CAMIE JONELLE NICHOLAUS DEVONNA [facility] [phone]

## 2024-08-23 ENCOUNTER — Ambulatory Visit: Payer: Self-pay | Admitting: Physician Assistant

## 2024-08-23 LAB — LIPID PANEL
Chol/HDL Ratio: 2.2 ratio (ref 0.0–4.4)
Cholesterol, Total: 182 mg/dL (ref 100–199)
HDL: 83 mg/dL (ref >39)
LDL Chol Calc (NIH): 89 mg/dL (ref 0–99)
Triglycerides: 49 mg/dL (ref 0–149)
VLDL Cholesterol Cal: 10 mg/dL (ref 5–40)

## 2024-08-23 LAB — COMPREHENSIVE METABOLIC PANEL WITH GFR
ALT: 21 IU/L (ref 0–32)
AST: 19 IU/L (ref 0–40)
Albumin: 4.3 g/dL (ref 3.9–4.9)
Alkaline Phosphatase: 94 IU/L (ref 49–135)
BUN/Creatinine Ratio: 19 (ref 12–28)
BUN: 13 mg/dL (ref 8–27)
Bilirubin Total: 0.4 mg/dL (ref 0.0–1.2)
CO2: 24 mmol/L (ref 20–29)
Calcium: 9.9 mg/dL (ref 8.7–10.3)
Chloride: 105 mmol/L (ref 96–106)
Creatinine, Ser: 0.7 mg/dL (ref 0.57–1.00)
Globulin, Total: 2.1 g/dL (ref 1.5–4.5)
Glucose: 95 mg/dL (ref 70–99)
Potassium: 4.8 mmol/L (ref 3.5–5.2)
Sodium: 142 mmol/L (ref 134–144)
Total Protein: 6.4 g/dL (ref 6.0–8.5)
eGFR: 97 mL/min/1.73 (ref >59)

## 2024-08-23 LAB — CBC WITH DIFFERENTIAL/PLATELET
Basophils Absolute: 0.1 x10E3/uL (ref 0.0–0.2)
Basos: 1 %
EOS (ABSOLUTE): 0.2 x10E3/uL (ref 0.0–0.4)
Eos: 4 %
Hematocrit: 42.1 % (ref 34.0–46.6)
Hemoglobin: 14 g/dL (ref 11.1–15.9)
Immature Grans (Abs): 0 x10E3/uL (ref 0.0–0.1)
Immature Granulocytes: 0 %
Lymphocytes Absolute: 1.4 x10E3/uL (ref 0.7–3.1)
Lymphs: 28 %
MCH: 29.5 pg (ref 26.6–33.0)
MCHC: 33.3 g/dL (ref 31.5–35.7)
MCV: 89 fL (ref 79–97)
Monocytes Absolute: 0.5 x10E3/uL (ref 0.1–0.9)
Monocytes: 10 %
Neutrophils Absolute: 2.8 x10E3/uL (ref 1.4–7.0)
Neutrophils: 57 %
Platelets: 224 x10E3/uL (ref 150–450)
RBC: 4.74 x10E6/uL (ref 3.77–5.28)
RDW: 12 % (ref 11.7–15.4)
WBC: 5 x10E3/uL (ref 3.4–10.8)

## 2024-08-23 LAB — TSH: TSH: 0.85 u[IU]/mL (ref 0.450–4.500)

## 2024-08-26 ENCOUNTER — Telehealth: Payer: Self-pay

## 2024-08-26 ENCOUNTER — Other Ambulatory Visit: Payer: Self-pay | Admitting: Physician Assistant

## 2024-08-26 DIAGNOSIS — R42 Dizziness and giddiness: Secondary | ICD-10-CM

## 2024-08-26 DIAGNOSIS — E782 Mixed hyperlipidemia: Secondary | ICD-10-CM

## 2024-08-26 DIAGNOSIS — R030 Elevated blood-pressure reading, without diagnosis of hypertension: Secondary | ICD-10-CM

## 2024-08-26 NOTE — Telephone Encounter (Signed)
 Demographics sent via epic.  Copied from CRM #[id number]. Topic: General - Other >> Aug 26, 2024  1:33 PM Rosina BIRCH wrote: Reason for CRM: kisha from [facility] called stating there is no demographics on the patient and they need demographics faxed to them to get the patient scheduled. She only had the name and DOB [phone]  [phone] Fax-[phone]

## 2024-08-28 ENCOUNTER — Telehealth: Payer: Self-pay

## 2024-08-28 NOTE — Telephone Encounter (Signed)
 Called patient to discuss some concerns she had about refer that was put in for cardiologist, left VM for patient to call back and ask to speak to me.

## 2024-08-28 NOTE — Telephone Encounter (Signed)
 The patient called back returning a call to Jacqua. I called and spoke with Jacqua and she had me transfer the patient for further assistance.

## 2024-08-28 NOTE — Telephone Encounter (Signed)
 Spoke with patient about refer, and made her aware she will need to call [facility]ologist office in [place] and make an appointment.
